# Patient Record
Sex: Female | Born: 1975 | Race: White | Hispanic: No | Smoking: Current some day smoker
Health system: Southern US, Community
[De-identification: ages and names within clinical notes are randomized; demographics above are authoritative.]

## PROBLEM LIST (undated history)

## (undated) DIAGNOSIS — F419 Anxiety disorder, unspecified: Secondary | ICD-10-CM

## (undated) DIAGNOSIS — I889 Nonspecific lymphadenitis, unspecified: Secondary | ICD-10-CM

## (undated) HISTORY — DX: Anxiety disorder, unspecified: F41.9

## (undated) HISTORY — PX: TUBAL LIGATION: SHX77

## (undated) HISTORY — PX: FOOT SURGERY: SHX648

## (undated) HISTORY — PX: UTERINE FIBROID SURGERY: SHX826

## (undated) HISTORY — PX: CHOLECYSTECTOMY: SHX55

## (undated) HISTORY — PX: APPENDECTOMY: SHX54

---

## 1997-07-20 ENCOUNTER — Emergency Department (HOSPITAL_COMMUNITY): Admission: EM | Admit: 1997-07-20 | Discharge: 1997-07-20 | Payer: Self-pay | Admitting: Emergency Medicine

## 1997-09-24 ENCOUNTER — Ambulatory Visit (HOSPITAL_COMMUNITY): Admission: RE | Admit: 1997-09-24 | Discharge: 1997-09-24 | Payer: Self-pay | Admitting: Obstetrics

## 1998-01-01 ENCOUNTER — Inpatient Hospital Stay (HOSPITAL_COMMUNITY): Admission: AD | Admit: 1998-01-01 | Discharge: 1998-01-03 | Payer: Self-pay | Admitting: Obstetrics

## 1998-05-30 ENCOUNTER — Ambulatory Visit (HOSPITAL_COMMUNITY): Admission: RE | Admit: 1998-05-30 | Discharge: 1998-05-30 | Payer: Self-pay | Admitting: Obstetrics

## 1998-12-14 ENCOUNTER — Emergency Department (HOSPITAL_COMMUNITY): Admission: EM | Admit: 1998-12-14 | Discharge: 1998-12-14 | Payer: Self-pay | Admitting: Emergency Medicine

## 1998-12-16 ENCOUNTER — Emergency Department (HOSPITAL_COMMUNITY): Admission: EM | Admit: 1998-12-16 | Discharge: 1998-12-16 | Payer: Self-pay | Admitting: Emergency Medicine

## 1999-11-14 ENCOUNTER — Inpatient Hospital Stay (HOSPITAL_COMMUNITY): Admission: EM | Admit: 1999-11-14 | Discharge: 1999-11-14 | Payer: Self-pay | Admitting: Obstetrics

## 2000-04-05 ENCOUNTER — Inpatient Hospital Stay (HOSPITAL_COMMUNITY): Admission: AD | Admit: 2000-04-05 | Discharge: 2000-04-05 | Payer: Self-pay | Admitting: Obstetrics

## 2000-07-01 ENCOUNTER — Inpatient Hospital Stay (HOSPITAL_COMMUNITY): Admission: AD | Admit: 2000-07-01 | Discharge: 2000-07-01 | Payer: Self-pay | Admitting: Obstetrics

## 2000-11-24 ENCOUNTER — Other Ambulatory Visit: Admission: RE | Admit: 2000-11-24 | Discharge: 2000-11-24 | Payer: Self-pay | Admitting: Family Medicine

## 2004-07-01 ENCOUNTER — Ambulatory Visit: Payer: Self-pay | Admitting: Family Medicine

## 2004-08-04 ENCOUNTER — Encounter: Payer: Self-pay | Admitting: Internal Medicine

## 2004-08-04 LAB — CONVERTED CEMR LAB: Pap Smear: NORMAL

## 2004-08-06 ENCOUNTER — Ambulatory Visit: Payer: Self-pay | Admitting: Internal Medicine

## 2004-08-06 ENCOUNTER — Encounter: Admission: RE | Admit: 2004-08-06 | Discharge: 2004-08-06 | Payer: Self-pay | Admitting: Internal Medicine

## 2004-08-13 ENCOUNTER — Other Ambulatory Visit: Admission: RE | Admit: 2004-08-13 | Discharge: 2004-08-13 | Payer: Self-pay | Admitting: Family Medicine

## 2004-08-13 ENCOUNTER — Ambulatory Visit: Payer: Self-pay | Admitting: Family Medicine

## 2004-08-20 ENCOUNTER — Encounter: Admission: RE | Admit: 2004-08-20 | Discharge: 2004-08-20 | Payer: Self-pay | Admitting: Family Medicine

## 2004-10-24 ENCOUNTER — Encounter: Payer: Self-pay | Admitting: Emergency Medicine

## 2004-10-24 ENCOUNTER — Observation Stay (HOSPITAL_COMMUNITY): Admission: AD | Admit: 2004-10-24 | Discharge: 2004-10-24 | Payer: Self-pay | Admitting: Obstetrics & Gynecology

## 2004-10-31 ENCOUNTER — Encounter: Admission: RE | Admit: 2004-10-31 | Discharge: 2004-10-31 | Payer: Self-pay | Admitting: Obstetrics & Gynecology

## 2004-11-12 ENCOUNTER — Ambulatory Visit (HOSPITAL_COMMUNITY): Admission: RE | Admit: 2004-11-12 | Discharge: 2004-11-12 | Payer: Self-pay | Admitting: Obstetrics & Gynecology

## 2005-05-14 ENCOUNTER — Ambulatory Visit: Payer: Self-pay | Admitting: Internal Medicine

## 2005-08-19 ENCOUNTER — Ambulatory Visit: Payer: Self-pay | Admitting: Internal Medicine

## 2005-12-14 ENCOUNTER — Ambulatory Visit: Payer: Self-pay | Admitting: Family Medicine

## 2006-05-01 ENCOUNTER — Emergency Department (HOSPITAL_COMMUNITY): Admission: EM | Admit: 2006-05-01 | Discharge: 2006-05-01 | Payer: Self-pay | Admitting: Family Medicine

## 2006-05-07 ENCOUNTER — Ambulatory Visit: Payer: Self-pay | Admitting: Family Medicine

## 2006-05-10 ENCOUNTER — Encounter: Admission: RE | Admit: 2006-05-10 | Discharge: 2006-05-10 | Payer: Self-pay | Admitting: Family Medicine

## 2006-05-13 ENCOUNTER — Ambulatory Visit: Payer: Self-pay | Admitting: Family Medicine

## 2006-05-17 ENCOUNTER — Ambulatory Visit: Payer: Self-pay | Admitting: Internal Medicine

## 2006-09-10 ENCOUNTER — Encounter: Payer: Self-pay | Admitting: Internal Medicine

## 2006-09-10 DIAGNOSIS — L259 Unspecified contact dermatitis, unspecified cause: Secondary | ICD-10-CM

## 2006-09-10 DIAGNOSIS — F17200 Nicotine dependence, unspecified, uncomplicated: Secondary | ICD-10-CM | POA: Insufficient documentation

## 2006-09-10 DIAGNOSIS — K219 Gastro-esophageal reflux disease without esophagitis: Secondary | ICD-10-CM

## 2006-09-10 DIAGNOSIS — L219 Seborrheic dermatitis, unspecified: Secondary | ICD-10-CM

## 2007-02-23 ENCOUNTER — Ambulatory Visit: Payer: Self-pay | Admitting: Family Medicine

## 2007-02-23 DIAGNOSIS — R51 Headache: Secondary | ICD-10-CM

## 2007-02-23 DIAGNOSIS — R599 Enlarged lymph nodes, unspecified: Secondary | ICD-10-CM | POA: Insufficient documentation

## 2007-02-23 DIAGNOSIS — R519 Headache, unspecified: Secondary | ICD-10-CM | POA: Insufficient documentation

## 2007-04-12 ENCOUNTER — Ambulatory Visit: Payer: Self-pay | Admitting: Family Medicine

## 2007-04-12 DIAGNOSIS — M26629 Arthralgia of temporomandibular joint, unspecified side: Secondary | ICD-10-CM

## 2007-05-24 ENCOUNTER — Encounter: Payer: Self-pay | Admitting: Internal Medicine

## 2007-05-24 ENCOUNTER — Emergency Department (HOSPITAL_COMMUNITY): Admission: EM | Admit: 2007-05-24 | Discharge: 2007-05-25 | Payer: Self-pay | Admitting: Emergency Medicine

## 2007-06-03 ENCOUNTER — Emergency Department (HOSPITAL_COMMUNITY): Admission: EM | Admit: 2007-06-03 | Discharge: 2007-06-03 | Payer: Self-pay | Admitting: Emergency Medicine

## 2007-06-08 ENCOUNTER — Ambulatory Visit: Payer: Self-pay | Admitting: Internal Medicine

## 2007-06-09 ENCOUNTER — Ambulatory Visit: Payer: Self-pay | Admitting: Oncology

## 2007-06-21 ENCOUNTER — Encounter: Payer: Self-pay | Admitting: Internal Medicine

## 2007-06-21 LAB — COMPREHENSIVE METABOLIC PANEL
BUN: 7 mg/dL (ref 6–23)
CO2: 25 mEq/L (ref 19–32)
Glucose, Bld: 73 mg/dL (ref 70–99)
Sodium: 141 mEq/L (ref 135–145)
Total Bilirubin: 0.3 mg/dL (ref 0.3–1.2)
Total Protein: 6.8 g/dL (ref 6.0–8.3)

## 2007-06-21 LAB — CBC WITH DIFFERENTIAL (CANCER CENTER ONLY)
BASO#: 0.1 10*3/uL (ref 0.0–0.2)
Eosinophils Absolute: 0.3 10*3/uL (ref 0.0–0.5)
HGB: 12.7 g/dL (ref 11.6–15.9)
LYMPH%: 26 % (ref 14.0–48.0)
MCH: 33.1 pg (ref 26.0–34.0)
MCV: 96 fL (ref 81–101)
MONO%: 5.5 % (ref 0.0–13.0)
Platelets: 267 10*3/uL (ref 145–400)
RBC: 3.84 10*6/uL (ref 3.70–5.32)

## 2007-06-21 LAB — IRON AND TIBC
Iron: 94 ug/dL (ref 42–145)
TIBC: 254 ug/dL (ref 250–470)

## 2007-06-21 LAB — SEDIMENTATION RATE: Sed Rate: 5 mm/hr (ref 0–22)

## 2007-06-21 LAB — LACTATE DEHYDROGENASE: LDH: 132 U/L (ref 94–250)

## 2007-06-23 ENCOUNTER — Telehealth (INDEPENDENT_AMBULATORY_CARE_PROVIDER_SITE_OTHER): Payer: Self-pay | Admitting: *Deleted

## 2007-06-23 DIAGNOSIS — Z9189 Other specified personal risk factors, not elsewhere classified: Secondary | ICD-10-CM

## 2007-06-24 ENCOUNTER — Ambulatory Visit (HOSPITAL_COMMUNITY): Admission: RE | Admit: 2007-06-24 | Discharge: 2007-06-24 | Payer: Self-pay | Admitting: Oncology

## 2007-06-24 ENCOUNTER — Encounter: Payer: Self-pay | Admitting: Internal Medicine

## 2007-06-28 ENCOUNTER — Ambulatory Visit: Payer: Self-pay | Admitting: Internal Medicine

## 2007-06-29 ENCOUNTER — Encounter: Payer: Self-pay | Admitting: Internal Medicine

## 2007-06-29 ENCOUNTER — Telehealth (INDEPENDENT_AMBULATORY_CARE_PROVIDER_SITE_OTHER): Payer: Self-pay | Admitting: *Deleted

## 2007-06-29 DIAGNOSIS — I7100 Dissection of unspecified site of aorta: Secondary | ICD-10-CM | POA: Insufficient documentation

## 2007-07-05 LAB — CONVERTED CEMR LAB

## 2007-07-11 ENCOUNTER — Ambulatory Visit: Payer: Self-pay | Admitting: Cardiovascular Disease

## 2007-07-11 ENCOUNTER — Ambulatory Visit: Payer: Self-pay

## 2007-07-22 ENCOUNTER — Ambulatory Visit: Payer: Self-pay | Admitting: Internal Medicine

## 2007-07-22 ENCOUNTER — Emergency Department (HOSPITAL_COMMUNITY): Admission: EM | Admit: 2007-07-22 | Discharge: 2007-07-22 | Payer: Self-pay | Admitting: Emergency Medicine

## 2007-08-02 ENCOUNTER — Telehealth: Payer: Self-pay | Admitting: Internal Medicine

## 2007-08-04 ENCOUNTER — Ambulatory Visit: Payer: Self-pay | Admitting: Internal Medicine

## 2007-08-04 DIAGNOSIS — F410 Panic disorder [episodic paroxysmal anxiety] without agoraphobia: Secondary | ICD-10-CM | POA: Insufficient documentation

## 2007-08-16 ENCOUNTER — Ambulatory Visit: Payer: Self-pay | Admitting: Cardiovascular Disease

## 2008-02-06 ENCOUNTER — Telehealth: Payer: Self-pay | Admitting: Internal Medicine

## 2008-02-07 ENCOUNTER — Ambulatory Visit: Payer: Self-pay | Admitting: Obstetrics and Gynecology

## 2008-04-27 ENCOUNTER — Ambulatory Visit (HOSPITAL_COMMUNITY): Admission: RE | Admit: 2008-04-27 | Discharge: 2008-04-28 | Payer: Self-pay | Admitting: Surgery

## 2008-04-27 ENCOUNTER — Encounter (INDEPENDENT_AMBULATORY_CARE_PROVIDER_SITE_OTHER): Payer: Self-pay | Admitting: Surgery

## 2008-10-06 ENCOUNTER — Emergency Department (HOSPITAL_COMMUNITY): Admission: EM | Admit: 2008-10-06 | Discharge: 2008-10-06 | Payer: Self-pay | Admitting: Family Medicine

## 2008-11-21 ENCOUNTER — Emergency Department (HOSPITAL_COMMUNITY): Admission: EM | Admit: 2008-11-21 | Discharge: 2008-11-21 | Payer: Self-pay | Admitting: Family Medicine

## 2009-06-01 ENCOUNTER — Inpatient Hospital Stay (HOSPITAL_COMMUNITY): Admission: AD | Admit: 2009-06-01 | Discharge: 2009-06-01 | Payer: Self-pay | Admitting: Obstetrics & Gynecology

## 2010-02-12 ENCOUNTER — Inpatient Hospital Stay (HOSPITAL_COMMUNITY): Admission: AD | Admit: 2010-02-12 | Discharge: 2010-02-12 | Payer: Self-pay | Admitting: Obstetrics & Gynecology

## 2010-02-12 ENCOUNTER — Ambulatory Visit: Payer: Self-pay | Admitting: Obstetrics and Gynecology

## 2010-04-27 ENCOUNTER — Encounter: Payer: Self-pay | Admitting: Obstetrics & Gynecology

## 2010-06-17 LAB — URINALYSIS, ROUTINE W REFLEX MICROSCOPIC
Bilirubin Urine: NEGATIVE
Protein, ur: NEGATIVE mg/dL
Specific Gravity, Urine: 1.02 (ref 1.005–1.030)
Urobilinogen, UA: 0.2 mg/dL (ref 0.0–1.0)
pH: 6 (ref 5.0–8.0)

## 2010-06-17 LAB — CBC
HCT: 37.3 % (ref 36.0–46.0)
MCH: 34.5 pg — ABNORMAL HIGH (ref 26.0–34.0)
MCV: 100.2 fL — ABNORMAL HIGH (ref 78.0–100.0)
Platelets: 177 10*3/uL (ref 150–400)
RBC: 3.73 MIL/uL — ABNORMAL LOW (ref 3.87–5.11)

## 2010-06-17 LAB — POCT PREGNANCY, URINE: Preg Test, Ur: NEGATIVE

## 2010-06-17 LAB — WET PREP, GENITAL
Trich, Wet Prep: NONE SEEN
Yeast Wet Prep HPF POC: NONE SEEN

## 2010-06-17 LAB — URINE MICROSCOPIC-ADD ON

## 2010-06-17 LAB — GC/CHLAMYDIA PROBE AMP, GENITAL: GC Probe Amp, Genital: NEGATIVE

## 2010-06-25 LAB — URINE MICROSCOPIC-ADD ON

## 2010-06-25 LAB — URINALYSIS, ROUTINE W REFLEX MICROSCOPIC
Ketones, ur: 15 mg/dL — AB
Nitrite: NEGATIVE
pH: 6 (ref 5.0–8.0)

## 2010-07-12 LAB — DIFFERENTIAL
Basophils Absolute: 0.1 10*3/uL (ref 0.0–0.1)
Basophils Relative: 1 % (ref 0–1)
Eosinophils Absolute: 0.1 10*3/uL (ref 0.0–0.7)
Eosinophils Relative: 1 % (ref 0–5)
Monocytes Absolute: 0.7 10*3/uL (ref 0.1–1.0)
Monocytes Relative: 6 % (ref 3–12)

## 2010-07-12 LAB — CBC
Hemoglobin: 13.2 g/dL (ref 12.0–15.0)
MCHC: 34.8 g/dL (ref 30.0–36.0)
MCV: 100.6 fL — ABNORMAL HIGH (ref 78.0–100.0)
RBC: 3.79 MIL/uL — ABNORMAL LOW (ref 3.87–5.11)
RDW: 13.9 % (ref 11.5–15.5)

## 2010-07-13 LAB — POCT I-STAT, CHEM 8
Calcium, Ion: 1.13 mmol/L (ref 1.12–1.32)
Glucose, Bld: 78 mg/dL (ref 70–99)
HCT: 43 % (ref 36.0–46.0)
Hemoglobin: 14.6 g/dL (ref 12.0–15.0)
TCO2: 24 mmol/L (ref 0–100)

## 2010-07-21 LAB — PREGNANCY, URINE: Preg Test, Ur: NEGATIVE

## 2010-08-19 NOTE — Assessment & Plan Note (Signed)
[facility]                                 ON-CALL NOTE   Melissa Allison, BOERNER                       MRN:          [id number]  DATE:06/02/2007                            DOB:          [birth year]    PCP:  Idamae Schuller A. Tower, M.D.   Ms. Melissa Allison is a patient of Dr. Milinda Antis, who called in stating that she  was started on the 18th or 19th on Keflex for an infection that was seen  at the [facility] Emergency Department.  Yesterday, she noted a  burning, red rash on her face, so she discontinued the medication last  night.  As of today, she complains of swelling of her tongue and some  swelling in the back of her throat.  She states that she is able to  breathe, but has noted some slight difficulty swallowing.   PLAN:  I advised patient that she should immediately call 911 to be  taken to the emergency department for further evaluation.  Patient  states that she does not feel that it is that urgent.  I advised  patient, given those symptoms and a possible anaphylactic reaction to  this medication, she needs emergent medical attention.  She states that  she is a single mother and will have to call someone to take care of her  children, but promises that she will go to the emergency department  ASAP.  Patient expressed understanding of my concerns and, again, agreed  to go to the emergency department.  I didn't recommend taking bendaryl  due to patient expressing swelling in throat.     Leanne Chang, M.D.  Electronically Signed    LA/MedQ  DD: 06/02/2007  DT: 06/03/2007  Job #: [id number]   cc:   Marne A. Milinda Antis, MD

## 2010-08-19 NOTE — Letter (Signed)
July 15, 2007    Karie Schwalbe, MD  [address] [place], [place] [address]   RE:  Melissa Allison, Melissa Allison  MRN:  [id number]  /  DOB:  April 10, 1975   Dear Dr. 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   Ms. Klausner is a 35 year old woman with no cardiovascular history who  has been undergoing an evaluation for lymphadenopathy.  This evaluation  included CAT scans of the chest, abdomen and pelvis.  Incidentally noted  on her abdominal CT was a small focal peripheral dissection of the  aorta beginning at the level of the interior mesenteric artery.  Because of this radiographic abnormality, she was referred here for  further evaluation.   Ms. Rinke has no history of hypertension.  She denies a family  history of vascular disease.  This includes no history of coronary  artery disease, stroke aortic dissection or aortic aneurysm.  She denies  abdominal pain.  Other than the lymphadenopathy noted above, her current  symptoms include low-grade fevers, fatigue and poor appetite.  She  denies chest pain.  She denies dyspnea, chest or abdominal pain,  orthopnea, PND or edema.   PAST MEDICAL HISTORY:  Includes uterine bleeding that required  cauterization procedure in 2006.  She was severely anemic and required a  blood transfusion, appendectomy, tubal ligation, foot surgery in 2007,  and several hospitalizations for recurrent pyelonephritis.   SOCIAL HISTORY:  The patient is married with three children.  She smokes  one-half pack of cigarettes daily for the last 15 years.  She rarely  drinks alcohol and has a remote history of drug use but none in the last  10 years.   FAMILY HISTORY:  There is no history of coronary artery disease, stroke,  TIA or aortic pathology in any first-degree family members.  There is no  history of sudden death in the family.   REVIEW OF SYSTEMS:  Pertinent positives  included fatigue, urinary  symptoms, anxiety, dizziness, and headaches.  All other 12-point systems  were reviewed and are negative except as detailed above.   MEDICATIONS:  Include lorazepam 0.5 mg b.i.d.   ALLERGIES:  Are multiple ANTIBIOTICS including KEFLEX and SULFA, as well  as ACIPHEX, PREMARIN and EPHEDRINE.   EXAMINATION:  The patient is alert and oriented.  She is a healthy-  appearing young woman in no acute distress.  Weight is 110 pounds, blood  pressure is 104/70 in both arms, heart rate is 76, respiratory rate is  12.  HEENT:  Normal.  NECK:  Normal carotid upstrokes without bruits.  Jugular venous pressure  is normal.  No thyromegaly or thyroid nodules.  LUNGS:  Clear bilaterally.  HEART:  The apex is discreet and nondisplaced.  The heart is regular  rate and rhythm without murmurs or gallops.  There is no right  ventricular heave or lift.  ABDOMEN:  Soft, nontender, no organomegaly.  No abdominal bruits.  BACK:  There is no CVA tenderness.  EXTREMITIES:  No clubbing, cyanosis or edema.  Peripheral pulses are 2+  and equal throughout.  SKIN:  Warm and dry without rash.  NEUROLOGIC:  Cranial nerves II-XII are intact.  Strength 5/5 and equal.   EKG shows sinus rhythm and is within normal limits.  Heart rate 76 beats  per minute.   ASSESSMENT:  This is a 35 year old woman with an incidental finding of a  small focal dissection of the abdominal aorta.  This finding is in the  setting of no risk factors for vascular disease other than cigarette  use.  I think the chances of Ms. Auxier having an abdominal aortic  dissection are extremely low.  In short, this clinical scenario just  does not make sense.  We decided to perform an abdominal aortic  ultrasound in the office today and this demonstrated a normal abdominal  aorta throughout as well as normal proximal common iliac arteries.  There was no evidence of dissection, double-lumen or flow disturbance.  Both the  superior and inferior mesenteric arteries are widely patent  with normal velocities.   I think for absolute certainty it would be reasonable to repeat a CAT  scan in approximately 3 months just to be absolutely sure that there is  no dissection here.  Again, this is just precautionary and I do not  think there is any true aortic pathology.  The CAT scan may also serve  to follow up the patient's lymphadenopathy, which at this point is not  pathologic.  I will review this with Dr. Welton Flakes to see if she would like  me to order the full chest, abdomen and pelvis CT for follow-up of her  lymphadenopathy.   Dr. Alphonsus Sias, thanks again for asking me to see Ms. Roberge.  Please feel  free to call at any time with questions.    Sincerely,      Veverly Fells. Excell Seltzer, MD  Electronically Signed    MDC/MedQ  DD: 07/15/2007  DT: 07/15/2007  Job #: [id number]   CC:    Drue Second, MD

## 2010-08-19 NOTE — Assessment & Plan Note (Signed)
[name], Melissa Allison              ACCOUNT NO.:  [id number]   MEDICAL RECORD NO.:  [id number]          PATIENT TYPE:  POB   LOCATION:  [facility] at [employer]         FACILITY:  [facility]   PHYSICIAN:  Argentina Donovan, MD        DATE OF BIRTH:  [birth year]   DATE OF SERVICE:                                  CLINIC NOTE   The patient is referred by [place] because of a LSIL:  CIN 1/AIN 1/HPV-  interpreted Pap smear that was done on September 06, 2007.  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  She was sent to [place] for colposcopy.  We explained the procedure to her in  detail and how it was done and why it was done.  The patient is a  smoker, and we have counseled her significantly to try and break that  habit, which is somewhat difficult because her husband is also a smoker.  The colposcopy was done in the following method:  The patient was placed  in dorsal lithotomy position.  External genitalia was normal with 2  small piercings, 1 around the clitoris and 1 just below the introitus  marital.  The vagina was clean and well rugated.  The cervix was clean  and parous with elliptical transverse os.  No sign of erosion.  The  cervix was treated with 3% acetic acid, and the transition zone was  easily seen, 360 degrees.  There were acetowhite changes around the  entire circumference of the cervix without mosaicism, abnormal vessels,  or punctations.  Endocervical curettage was done without incident  followed by endocervical brushing and a biopsy at 12 o'clock at the  largest and most prominent area of acetowhite change.  Monsel solution  was then applied to control bleeding.  The patient will return in 2  weeks for discussion of followup treatment if any needed.   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          ______________________________  Argentina Donovan, MD     PR/MEDQ  D:  02/07/2008  T:  02/08/2008  Job:  [id number]

## 2010-08-19 NOTE — Op Note (Signed)
Melissa Allison, Melissa Allison              ACCOUNT NO.:  [id number]   MEDICAL RECORD NO.:  [id number]          PATIENT TYPE:  OIB   LOCATION:  1508                         FACILITY:  [facility]   PHYSICIAN:  Thornton Park. Daphine Deutscher, MD  DATE OF BIRTH:  10-24-1975   DATE OF PROCEDURE:  04/27/2008  DATE OF DISCHARGE:  04/28/2008                               OPERATIVE REPORT   PROCEDURE:  Laparoscopic cholecystectomy with intraoperative  cholangiogram.   SURGEON:  Thornton Park. Daphine Deutscher, M.D.   ASSISTANT:  Alfonse Ras, M.D.   ANESTHESIA:  General endotracheal.   DESCRIPTION OF PROCEDURE:  Melissa Allison was taken to Room 11 on Friday  afternoon, April 27, 2008, and given general anesthesia.  The abdomen  was prepped with chloroxylenol and draped sterilely.  A longitudinal  incision was made under umbilicus through which the [facility] cannula was  deployed.  The abdomen was insufflated and then three 5-mm ports were  placed in the upper abdomen, avoiding the tattoos on the right side, as  per her request.  The gallbladder was long and hanging down and rather  ptotic and was stuck to the omentum.  This was taken down with sharp  dissection and then I was able to gradually get the gallbladder up in  strip away the adhesions to it.  This was very indicative of chronic  cholecystitis.  I went ahead and dissected free Calot's triangle and she  had a prominent Calot's node.  I went ahead and used a 5-mm clip applier  to clip the cystic artery and I put up one up on the gallbladder and  then did a cholangiogram which showed a long cystic duct with  intrahepatic filling and free flow into the duodenum.  The cystic duct  was then triple clipped, divided, the cystic artery was divided and the  gallbladder was removed the gallbladder bed with the hook electrocautery  without entering it.  I then brought it out through the umbilicus using  a bag and found that she had three large stones.  I had to enlarge the  incision just because the stones were so large and they would otherwise  not come out.   I went back in and repaired the umbilical defect with a figure-of-eight  suture and a simple suture 0 Vicryl under laparoscopic vision using the  5 scope from above and then surveyed the abdomen again, no bleeding or  bile leaks were noted.  Everything appeared to be in order.  I injected  the wounds with Marcaine and closed with 4-0 Vicryl and with Dermabond.  The patient was taken to the recovery room in satisfactory condition.      Thornton Park Daphine Deutscher, MD  Electronically Signed     MBM/MEDQ  D:  04/27/2008  T:  04/29/2008  Job:  [id number]

## 2010-09-07 ENCOUNTER — Inpatient Hospital Stay (INDEPENDENT_AMBULATORY_CARE_PROVIDER_SITE_OTHER)
Admission: RE | Admit: 2010-09-07 | Discharge: 2010-09-07 | Disposition: A | Discharge status: Home / Self Care | Payer: Self-pay | Source: Ambulatory Visit | Attending: Emergency Medicine | Admitting: Emergency Medicine

## 2010-09-07 DIAGNOSIS — N390 Urinary tract infection, site not specified: Secondary | ICD-10-CM

## 2010-09-07 LAB — POCT URINALYSIS DIP (DEVICE)
Glucose, UA: NEGATIVE mg/dL
Hgb urine dipstick: NEGATIVE
Protein, ur: NEGATIVE mg/dL
Specific Gravity, Urine: 1.015 (ref 1.005–1.030)
Urobilinogen, UA: 0.2 mg/dL (ref 0.0–1.0)
pH: 6 (ref 5.0–8.0)

## 2010-11-02 ENCOUNTER — Ambulatory Visit (INDEPENDENT_AMBULATORY_CARE_PROVIDER_SITE_OTHER): Payer: Self-pay

## 2010-11-02 ENCOUNTER — Inpatient Hospital Stay (INDEPENDENT_AMBULATORY_CARE_PROVIDER_SITE_OTHER)
Admission: RE | Admit: 2010-11-02 | Discharge: 2010-11-02 | Disposition: A | Discharge status: Home / Self Care | Payer: Self-pay | Source: Ambulatory Visit | Attending: Family Medicine | Admitting: Family Medicine

## 2010-11-02 DIAGNOSIS — R071 Chest pain on breathing: Secondary | ICD-10-CM

## 2010-12-26 LAB — MONONUCLEOSIS SCREEN: Mono Screen: NEGATIVE

## 2010-12-26 LAB — CBC
MCHC: 35.3
MCV: 96.2
Platelets: 235

## 2010-12-26 LAB — DIFFERENTIAL
Basophils Relative: 1
Eosinophils Absolute: 0.2
Neutrophils Relative %: 62

## 2010-12-30 LAB — URINALYSIS, ROUTINE W REFLEX MICROSCOPIC
Glucose, UA: NEGATIVE
Ketones, ur: NEGATIVE
Specific Gravity, Urine: 1.007
pH: 7

## 2010-12-30 LAB — URINE MICROSCOPIC-ADD ON

## 2010-12-30 LAB — COMPREHENSIVE METABOLIC PANEL
AST: 19
Albumin: 3.9
Chloride: 110
Creatinine, Ser: 0.81
GFR calc Af Amer: >60
Potassium: 3.7
Total Bilirubin: 0.2 — ABNORMAL LOW

## 2010-12-30 LAB — POCT CARDIAC MARKERS
CKMB, poc: <1 — ABNORMAL LOW
Myoglobin, poc: 25.3

## 2010-12-30 LAB — DIFFERENTIAL
Basophils Absolute: 0.1
Eosinophils Relative: 2
Lymphocytes Relative: 19
Monocytes Absolute: 0.6
Monocytes Relative: 6

## 2010-12-30 LAB — CBC
MCV: 97.4
Platelets: 219
WBC: 9.3

## 2010-12-30 LAB — PREGNANCY, URINE: Preg Test, Ur: NEGATIVE

## 2011-08-24 ENCOUNTER — Encounter (HOSPITAL_COMMUNITY): Payer: Self-pay | Admitting: Emergency Medicine

## 2011-08-24 ENCOUNTER — Emergency Department (HOSPITAL_COMMUNITY)
Admission: EM | Admit: 2011-08-24 | Discharge: 2011-08-24 | Discharge status: Against Medical Advice | Payer: Self-pay | Attending: Emergency Medicine | Admitting: Emergency Medicine

## 2011-08-24 DIAGNOSIS — D235 Other benign neoplasm of skin of trunk: Secondary | ICD-10-CM | POA: Insufficient documentation

## 2011-08-24 DIAGNOSIS — D229 Melanocytic nevi, unspecified: Secondary | ICD-10-CM

## 2011-08-24 DIAGNOSIS — F17200 Nicotine dependence, unspecified, uncomplicated: Secondary | ICD-10-CM | POA: Insufficient documentation

## 2011-08-24 HISTORY — DX: Nonspecific lymphadenitis, unspecified: I88.9

## 2011-08-24 NOTE — ED Notes (Signed)
States has a spot on back that "burns"-mole on back, under bra strap -- feels irritated

## 2011-08-24 NOTE — Discharge Instructions (Signed)
Melissa Allison the growth on your back can be evaluated by a dermatologist.  The mole can be biopsied at a dermatologist office.   Mole Moles are usually harmless skin spots. Moles can be different colors, from light brown to black. People usually develop many of them over their lifetime. Moles increase in number during the first 3 decades of life. Moles can be anywhere on the body. They may be flat or raised and sometimes they contain hair. Although moles can change over time, they only rarely turn into skin cancer. The moles that do not turn into cancer (benign) are usually:  Small (the size of a pencil eraser or less).   One color.   Smooth.   Have a uniform border.   Round and oval and do not change much in appearance over time.  Malignant melanoma is a skin cancer that starts like a mole. Moles that were present at birth are more likely to turn into a melanoma later in life especially if they were very large (larger than 7 inches [20 cm] in diameter) at birth. Melanomas are different from moles. Melanomas:  Have Borders that are more ragged.   Have more than one color (red, white or blue) in addition to brown or black.   Are usually bigger.  Years of sun exposure increases the risk for getting melanoma. It is important to look at your moles regularly so that you can notice any changes that may occur within anyone of them that make it different from your other moles on your body. Know the A, B, C, D's of your moles - a mole is more worrisome for a melanoma if: A: It is Asymmetrical (one half is different from the other half). B: The Borders are jagged, etched or irregular. C: The Color is uneven (shades of brown, tan and/or black).  D: The Diameter is greater than a quarter of an inch (6 mm). SEEK MEDICAL CARE IF:  Your mole bleeds or becomes an open sore or does not heal.   Your mole grows rapidly or changes color.   Your mole becomes irregular and bumpy.   Your mole becomes  painful, itchy, tender or sensitive.  Document Released: 04/30/2004 Document Revised: 03/12/2011 Document Reviewed: 03/09/2008 [facility] Patient Information 2012 [place], [place].

## 2011-08-24 NOTE — ED Provider Notes (Signed)
History     CSN: [id number]  Arrival date & time 08/24/11  7829   First MD Initiated Contact with Patient 08/24/11 1015      Chief Complaint  Patient presents with  . irritated  mole on back     (Consider location/radiation/quality/duration/timing/severity/associated sxs/prior treatment) The history is provided by the patient. No language interpreter was used.   Cc: 36 year old female here today complaining of a "mole" on her back that has been there her whole life.  States that her father and mother have been diagnosed with cancer and she wants someone to see her today and tell her if the mole is cancer. States that she has no insurance and she does not have the money to go to a specialist. States that she wants someone to the cancer Center to come in with her mole diagnosed whether it's cancer or not "today".  Teary eyed in triage.  " There are 3 small children depending on me and this needs to be settled today"  Area in question is a pink, flat 2cm area on her mid back.  Patient became angry when i told her we do not excise moles in the ER.    Past Medical History  Diagnosis Date  . Lymphadenitis     Past Surgical History  Procedure Date  . Cholecystectomy   . Appendectomy   . Foot surgery   . Tubal ligation     No family history on file.  History  Substance Use Topics  . Smoking status: Current Everyday Smoker -- 1.0 packs/day  . Smokeless tobacco: Not on file  . Alcohol Use: No    OB History    Grav Para Term Preterm Abortions TAB SAB Ect Mult Living                  Review of Systems  Constitutional: Negative.   HENT: Negative.   Eyes: Negative.   Respiratory: Negative.   Cardiovascular: Negative.   Gastrointestinal: Negative.   Skin:       Mole on back was itching last year burning now  Neurological: Negative.   Psychiatric/Behavioral: Negative.   All other systems reviewed and are negative.    Allergies  Cephalexin; Conjugated estrogens;  Cyclobenzaprine hcl; Ephedrine sulfate; Rabeprazole sodium; and Sulfonamide derivatives  Home Medications  No current outpatient prescriptions on file.  BP 109/71  Pulse 55  Temp(Src) 97.9 F (36.6 C) (Oral)  Resp 16  Ht 5\' 3"  (1.6 m)  Wt 98 lb (44.453 kg)  BMI 17.36 kg/m2  SpO2 99%  LMP 08/03/2011  Physical Exam  Nursing note and vitals reviewed. Constitutional: She is oriented to person, place, and time. She appears well-developed and well-nourished.  HENT:  Head: Normocephalic and atraumatic.  Eyes: Conjunctivae and EOM are normal. Pupils are equal, round, and reactive to light.  Neck: Normal range of motion. Neck supple.  Cardiovascular: Normal rate.   Pulmonary/Chest: Effort normal and breath sounds normal.  Abdominal: Soft. Bowel sounds are normal.  Musculoskeletal: Normal range of motion. She exhibits no edema and no tenderness.  Neurological: She is alert and oriented to person, place, and time. She has normal reflexes.  Skin: Skin is warm and dry.       symetrical 2cm round flat area light pink no bleeding.  Psychiatric: She has a normal mood and affect.    ED Course  Procedures (including critical care time)  Labs Reviewed - No data to display No results found.   No diagnosis  found.    MDM  Here to get a mole excised in the ER.  Became angry and left ama after exam and before discharge because we could not get a doctor in here from the cancer center to excise her mole.         Remi Haggard, NP 08/24/11 1840

## 2011-08-25 NOTE — ED Provider Notes (Signed)
Medical screening examination/treatment/procedure(s) were conducted as a shared visit with non-physician practitioner(s) and myself.  I personally evaluated the patient during the encounter  The patient has evidence of a mole which may benefit from biopsy as an outpatient.  There is no indication or ability to biopsy moles in the emergency department.  This was explained to the patient.  I recommended close followup with her primary care physician or in urgent care to have this biopsied.  It was stressed that she may have to come up with the funds herself to pay for this.  There is no signs of infection.  Lyanne Co, MD 08/25/11 1319

## 2011-09-09 ENCOUNTER — Encounter: Payer: Self-pay | Admitting: Family Medicine

## 2011-09-09 ENCOUNTER — Ambulatory Visit (INDEPENDENT_AMBULATORY_CARE_PROVIDER_SITE_OTHER): Payer: Self-pay | Admitting: Family Medicine

## 2011-09-09 VITALS — BP 99/66 | HR 83 | Temp 98.2°F | Ht 63.0 in | Wt 97.0 lb

## 2011-09-09 DIAGNOSIS — R87612 Low grade squamous intraepithelial lesion on cytologic smear of cervix (LGSIL): Secondary | ICD-10-CM

## 2011-09-09 DIAGNOSIS — F39 Unspecified mood [affective] disorder: Secondary | ICD-10-CM

## 2011-09-09 DIAGNOSIS — D239 Other benign neoplasm of skin, unspecified: Secondary | ICD-10-CM

## 2011-09-09 DIAGNOSIS — IMO0002 Reserved for concepts with insufficient information to code with codable children: Secondary | ICD-10-CM

## 2011-09-09 DIAGNOSIS — D229 Melanocytic nevi, unspecified: Secondary | ICD-10-CM | POA: Insufficient documentation

## 2011-09-09 NOTE — Assessment & Plan Note (Signed)
Patient did not attend today for full evaluation. At next visit would like to discuss possibility of bipolar disorder and give GAD. Likely send to [facility] for therapy

## 2011-09-09 NOTE — Progress Notes (Signed)
  Subjective:    Patient ID: Melissa Allison, female    DOB: 01-10-1976, 36 y.o.   MRN: [id number]  HPI  Mole on back- itching and burning x 1 year. Mole seems to be growing.  Used to be flat.    Had a friend die from cancer, so she is concerned about cancer now.  Mood- sleep issues, can't sleep for days.  Feels like has a lot of energy during those times.  Recent break up from her boyfriend, moving, lots of stress.  Also has times of depression.    Pap smear- last one in 2009.  Had LSIL.  Had colonoscopy but does not know the results of that.  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   Review of Systems Significant for anxiety and mood disorder. No shortness breath, chest pain, headache.    Objective:   Physical Exam Vital signs reviewed General appearance - alert, well appearing, and in no distress Skin- there is a half centimeter raised lesion that is pink in color with some brown on the periphery.       Assessment & Plan:

## 2011-09-09 NOTE — Assessment & Plan Note (Signed)
Plan to biopsy mole at next visit. Patient needs to register for [facility] card.

## 2011-09-09 NOTE — Assessment & Plan Note (Signed)
Repeat Pap at next visit.

## 2011-09-09 NOTE — Patient Instructions (Signed)
Please make an appointment to come back after you have a [facility] card We can biopsy that lesion on your back and do your Pap smear We can also order your labs at that time

## 2011-10-05 ENCOUNTER — Other Ambulatory Visit: Payer: Self-pay

## 2011-10-05 ENCOUNTER — Ambulatory Visit (INDEPENDENT_AMBULATORY_CARE_PROVIDER_SITE_OTHER): Payer: Self-pay | Admitting: Family Medicine

## 2011-10-05 ENCOUNTER — Ambulatory Visit (HOSPITAL_COMMUNITY)
Admission: RE | Admit: 2011-10-05 | Discharge: 2011-10-05 | Disposition: A | Discharge status: Home / Self Care | Payer: Self-pay | Source: Ambulatory Visit | Attending: Family Medicine | Admitting: Family Medicine

## 2011-10-05 ENCOUNTER — Encounter: Payer: Self-pay | Admitting: Family Medicine

## 2011-10-05 VITALS — BP 106/66 | HR 78 | Ht 63.0 in | Wt 91.0 lb

## 2011-10-05 DIAGNOSIS — R079 Chest pain, unspecified: Secondary | ICD-10-CM | POA: Insufficient documentation

## 2011-10-05 DIAGNOSIS — F411 Generalized anxiety disorder: Secondary | ICD-10-CM

## 2011-10-05 DIAGNOSIS — I498 Other specified cardiac arrhythmias: Secondary | ICD-10-CM | POA: Insufficient documentation

## 2011-10-05 DIAGNOSIS — R200 Anesthesia of skin: Secondary | ICD-10-CM

## 2011-10-05 DIAGNOSIS — R209 Unspecified disturbances of skin sensation: Secondary | ICD-10-CM

## 2011-10-05 MED ORDER — BUPROPION HCL ER (XL) 150 MG PO TB24: 150.0000 mg | ORAL_TABLET | Freq: Every day | ORAL | Status: DC

## 2011-10-05 NOTE — Patient Instructions (Addendum)
Schedule adult physical appointment with Dr. Tye Savoy in 2 weeks. Start taking Wellbutrin 150 mg 1 tablet daily for anxiety. You may take Motrin over the counter 600 mg every 6 hours as needed for pain. If you need to speak to a counselor at anytime, please call the Crisis Line [facility].  WELLBUTRIN:  What are some side effects of this drug?  All drugs may cause side effects. However, many people have no side effects or only have minor side effects. Call your doctor if any of these side effects or any other side effects bother you.  Marland KitchenFeeling lightheaded, sleepy, having blurred eyesight, or a change in thinking clearly. Avoid driving and doing other tasks or actions that call for you to be alert or have clear eyesight until you see how this drug affects you.  .You may see the tablet shell in your stool.  Marland KitchenHot flashes.  .Headache.  .Belly pain.  .Shakiness.  .Nervous and excitable.  Marland KitchenUpset stomach or throwing up. Many small meals, good mouth care, sucking hard, sugar-free candy, or chewing sugar-free gum may help.  .Hard stools (constipation). Drinking more liquids, working out, or adding fiber to your diet may help. Talk with your doctor about a stool softener or laxative.  .Dry mouth. Good mouth care, sucking hard, sugar-free candy, or chewing sugar-free gum may help. See a dentist often.  .Not able to sleep.  .Bad taste in your mouth. This most often goes back to normal.  .Not hungry.  Marland KitchenUnsafe allergic effects may rarely happen. What are some side effects that I need to call my doctor about right away?  If you have any of these side effects:  .Signs of an allergic reaction, like rash; hives; itching; red, swollen, blistered, or peeling skin with or without fever; wheezing; tightness in the chest or throat; trouble breathing or talking; unusual hoarseness; or swelling of the mouth, face, lips, tongue, or throat.  .Signs of low mood (depression), thoughts of killing  yourself, nervousness, emotional ups and downs, thinking that is not normal, anxiety, or lack of interest in life.  .Change in thinking clearly and with logic.  .A fast heartbeat.  .Very bad headache.  .Very nervous and excitable.  .If you have not been able to quit smoking after taking this drug for 12 weeks.  .If seizures are new or worse after starting this drug.  These are not all of the side effects that may occur. If you have questions about side effects, call your doctor. Call your doctor for medical advice about side effects.  You may report side effects to the FDA at 1-800-FDA-1088. You may also report side effects at http://www.martinez-harris.com/.  You may report side effects to [employer]'s [place] Vigilance Program at (410)208-7405.

## 2011-10-06 ENCOUNTER — Encounter: Payer: Self-pay | Admitting: Family Medicine

## 2011-10-06 DIAGNOSIS — R200 Anesthesia of skin: Secondary | ICD-10-CM | POA: Insufficient documentation

## 2011-10-06 NOTE — Assessment & Plan Note (Addendum)
Left arm numbness and soreness likely secondary to anxiety disorder.  EKG was obtained due to LT arm and LT chest soreness.  EKG NSR, no ST abnormalities. Due to normal EKG in a healthy female, this is less likely cardiac in origin.  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 Ibuprofen for soreness, warm or ice pads to neck. Will treat anxiety with Welbutrin and follow up GAD in 2-4 weeks.

## 2011-10-06 NOTE — Progress Notes (Signed)
  Subjective:    Patient ID: Melissa Allison, female    DOB: 25-Apr-1975, 36 y.o.   MRN: [id number]  HPI  Patient presents to clinic to meet new doctor.  She complains of LT arm soreness, weakness, and numbness that has been going on for 2-3 weeks.  Associated with LT shoulder and chest discomfort.  Patient denies any SOB, dyspnea, nausea/vomiting, diaphoresis.  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  Describes soreness as "burning sensation", constant.  No relieved or aggravated by anything.   She does not have a strong family history of CAD nor does she have any history of HL, HTN, DM.  Patient is tearful throughout exam and wants to get out of [place] and start over.  She denies any homicidal or suicidal ideations.  States "I need to live for my kids."  No previous hx of suicidal thoughts, and denies any recent drug or alcohol abuse.   Review of Systems  Per HPI    Objective:   Physical Exam  Constitutional: No distress.       Tearful; very thin  Neck: Neck supple.       No bony tenderness of cervical spine; no muscle tension, erythema, or warmth.  Full ROM.    Cardiovascular: Normal rate and regular rhythm.   Skin:       Covered in tattoos, but no rashes or bruises  Psychiatric: Her speech is normal and behavior is normal. She exhibits a depressed mood. She expresses no homicidal and no suicidal ideation.    EKG: normal sinus, no ST abnormalities      Assessment & Plan:

## 2011-10-06 NOTE — Assessment & Plan Note (Signed)
GAD likely complicated by underlying mood disorder or major depression. Will treat with Bupropion 150 daily and follow up in 2 weeks. Counseling and crisis line information given to patient for social support.

## 2011-10-20 ENCOUNTER — Other Ambulatory Visit (HOSPITAL_COMMUNITY)
Admission: RE | Admit: 2011-10-20 | Discharge: 2011-10-20 | Disposition: A | Discharge status: Home / Self Care | Payer: Self-pay | Source: Ambulatory Visit | Attending: Family Medicine | Admitting: Family Medicine

## 2011-10-20 ENCOUNTER — Ambulatory Visit (INDEPENDENT_AMBULATORY_CARE_PROVIDER_SITE_OTHER): Payer: Self-pay | Admitting: Family Medicine

## 2011-10-20 ENCOUNTER — Encounter: Payer: Self-pay | Admitting: Family Medicine

## 2011-10-20 VITALS — BP 104/67 | HR 87 | Temp 98.5°F | Ht 63.0 in | Wt 96.0 lb

## 2011-10-20 DIAGNOSIS — Z01419 Encounter for gynecological examination (general) (routine) without abnormal findings: Secondary | ICD-10-CM | POA: Insufficient documentation

## 2011-10-20 DIAGNOSIS — Z789 Other specified health status: Secondary | ICD-10-CM

## 2011-10-20 DIAGNOSIS — Z113 Encounter for screening for infections with a predominantly sexual mode of transmission: Secondary | ICD-10-CM | POA: Insufficient documentation

## 2011-10-20 DIAGNOSIS — Z23 Encounter for immunization: Secondary | ICD-10-CM

## 2011-10-20 DIAGNOSIS — Z2089 Contact with and (suspected) exposure to other communicable diseases: Secondary | ICD-10-CM

## 2011-10-20 DIAGNOSIS — Z0000 Encounter for general adult medical examination without abnormal findings: Secondary | ICD-10-CM

## 2011-10-20 DIAGNOSIS — Z202 Contact with and (suspected) exposure to infections with a predominantly sexual mode of transmission: Secondary | ICD-10-CM

## 2011-10-20 DIAGNOSIS — Z9189 Other specified personal risk factors, not elsewhere classified: Secondary | ICD-10-CM | POA: Insufficient documentation

## 2011-10-20 NOTE — Progress Notes (Signed)
  Subjective:     Melissa Allison is a 36 y.o. female and is here for a comprehensive physical exam. The patient reports possible exposure to STD and Hepatitis due to history of tattoos.  She would like to be checked for STD and Hepatitis C.  Patient cannot recall last pap exam date.  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  She is requesting immunizations, but we do not have data on [facility].  Patient will contact her high school and go to [facility] for immunizations.  Of note, patient is not taking SSRI for GAD due to fear of side effects.  She says her home life is improving and her stress is improving as well.   History   Social History  . Marital Status: Divorced    Spouse Name: N/A    Number of Children: N/A  . Years of Education: N/A   Occupational History  . Not on file.   Social History Main Topics  . Smoking status: Current Everyday Smoker -- 1.0 packs/day  . Smokeless tobacco: Not on file  . Alcohol Use: No  . Drug Use: No  . Sexually Active:    Other Topics Concern  . Not on file   Social History Narrative  . No narrative on file   Health Maintenance  Topic Date Due  . Pap Smear  12/25/1995  . Tetanus/tdap  12/24/1996  . Influenza Vaccine  01/05/2012    The following portions of the patient's history were reviewed and updated as appropriate: allergies, current medications, past family history, past medical history, past social history and problem list.  Review of Systems Pertinent items are noted in HPI.   Objective:    General appearance: alert, cooperative and no distress Throat: lips, mucosa, and tongue normal; teeth and gums normal Lungs: clear to auscultation bilaterally Heart: regular rate and rhythm, S1, S2 normal, no murmur, click, rub or gallop Abdomen: soft, non-tender; bowel sounds normal; no masses,  no organomegaly Pelvic: cervix normal in appearance, external genitalia normal, no adnexal masses or tenderness, no  cervical motion tenderness, rectovaginal septum normal, uterus normal size, shape, and consistency and vaginal discharge present Extremities: extremities normal, atraumatic, no cyanosis or edema Skin: Skin color, texture, turgor normal. No rashes or lesions or multiple tattoos on chest and arms    Assessment:    Healthy female exam.    Exposure to tattoos - at risk for Hep C.     Plan:    See Problem List. See After Visit Summary for Counseling Recommendations

## 2011-10-20 NOTE — Assessment & Plan Note (Signed)
Pap, GC/chlamydia performed today.  Patient needs to contact high school for immunizations.  Gave TDAP today.

## 2011-10-20 NOTE — Assessment & Plan Note (Signed)
Will check HIV antibody and RPR.

## 2011-10-20 NOTE — Assessment & Plan Note (Signed)
Will check Hepatitis C antibody reflex today due history of tattoos/high risk for Hep C.

## 2011-10-20 NOTE — Patient Instructions (Addendum)
We will give you Tetanus today. Please follow up with [facility] Immunizations for out of country shots. Return to clinic for biopsy of mole on your back - either with Dr. Tye Savoy or [facility].

## 2011-10-26 ENCOUNTER — Encounter: Payer: Self-pay | Admitting: Family Medicine

## 2011-11-18 ENCOUNTER — Emergency Department (HOSPITAL_COMMUNITY): Payer: Self-pay

## 2011-11-18 ENCOUNTER — Other Ambulatory Visit: Payer: Self-pay

## 2011-11-18 ENCOUNTER — Encounter (HOSPITAL_COMMUNITY): Payer: Self-pay

## 2011-11-18 ENCOUNTER — Emergency Department (HOSPITAL_COMMUNITY)
Admission: EM | Admit: 2011-11-18 | Discharge: 2011-11-18 | Disposition: A | Discharge status: Home / Self Care | Payer: Self-pay | Attending: Emergency Medicine | Admitting: Emergency Medicine

## 2011-11-18 DIAGNOSIS — R0789 Other chest pain: Secondary | ICD-10-CM | POA: Insufficient documentation

## 2011-11-18 DIAGNOSIS — F17200 Nicotine dependence, unspecified, uncomplicated: Secondary | ICD-10-CM | POA: Insufficient documentation

## 2011-11-18 DIAGNOSIS — Z9089 Acquired absence of other organs: Secondary | ICD-10-CM | POA: Insufficient documentation

## 2011-11-18 LAB — CBC WITH DIFFERENTIAL/PLATELET
Eosinophils Absolute: 0.1 10*3/uL (ref 0.0–0.7)
Eosinophils Relative: 1 % (ref 0–5)
HCT: 39.4 % (ref 36.0–46.0)
Hemoglobin: 13.4 g/dL (ref 12.0–15.0)
Lymphs Abs: 2.3 10*3/uL (ref 0.7–4.0)
MCH: 33.1 pg (ref 26.0–34.0)
MCV: 97.3 fL (ref 78.0–100.0)
Monocytes Relative: 7 % (ref 3–12)
RBC: 4.05 MIL/uL (ref 3.87–5.11)

## 2011-11-18 LAB — COMPREHENSIVE METABOLIC PANEL
Alkaline Phosphatase: 58 U/L (ref 39–117)
BUN: 13 mg/dL (ref 6–23)
Calcium: 9 mg/dL (ref 8.4–10.5)
GFR calc Af Amer: >90 mL/min (ref >90)
GFR calc non Af Amer: >90 mL/min (ref >90)
Glucose, Bld: 79 mg/dL (ref 70–99)
Total Protein: 7.1 g/dL (ref 6.0–8.3)

## 2011-11-18 LAB — POCT I-STAT TROPONIN I: Troponin i, poc: 0 ng/mL (ref 0.00–0.08)

## 2011-11-18 MED ORDER — ALPRAZOLAM 0.25 MG PO TABS
0.5000 mg | ORAL_TABLET | Freq: Once | ORAL | Status: AC
  Administered 2011-11-18: 0.5 mg via ORAL
  Filled 2011-11-18: qty 2

## 2011-11-18 NOTE — ED Provider Notes (Signed)
I have supervised the resident with this patient. I personally examined the patient. Basically, this is a 36 yo F with L sided CP for the last 3 days. While driving today, she had another episode that is more severe. Her lung and cardiac exam is nl. Labs showed CBC, CMP nl, Trop neg x 2, low risk for PE. She has a hx of anxiety and felt better after xanax. She is d/c home with pcp f/u.   Richardean Canal, MD 11/18/11 2059

## 2011-11-18 NOTE — ED Provider Notes (Signed)
History     CSN: [id number]  Arrival date & time 11/18/11  1238   First MD Initiated Contact with Patient 11/18/11 1639      Chief Complaint  Patient presents with  . Chest Pain    (Consider location/radiation/quality/duration/timing/severity/associated sxs/prior treatment) Patient is a 36 y.o. female presenting with chest pain. The history is provided by the patient.  Chest Pain The chest pain began yesterday. Chest pain occurs intermittently. The chest pain is resolved. Associated with: unknown. At its most intense, the pain is at 8/10. The pain is currently at 0/10. The quality of the pain is described as sharp (left lower chest). The pain does not radiate. Pertinent negatives for primary symptoms include no fever, no syncope, no shortness of breath, no cough, no wheezing, no palpitations, no abdominal pain, no nausea, no vomiting and no dizziness. Primary symptoms comment: left arm pain, lightheadedness  Pertinent negatives for associated symptoms include no lower extremity edema, no near-syncope, no orthopnea, no paroxysmal nocturnal dyspnea and no weakness. She tried nothing for the symptoms. Risk factors include no known risk factors.  Pertinent negatives for past medical history include no CAD.     Past Medical History  Diagnosis Date  . Lymphadenitis     Past Surgical History  Procedure Date  . Cholecystectomy   . Appendectomy   . Foot surgery   . Tubal ligation     History reviewed. No pertinent family history.  History  Substance Use Topics  . Smoking status: Current Everyday Smoker -- 1.0 packs/day  . Smokeless tobacco: Not on file  . Alcohol Use: No    OB History    Grav Para Term Preterm Abortions TAB SAB Ect Mult Living                  Review of Systems  Constitutional: Negative for fever and chills.  HENT: Negative.   Respiratory: Negative for cough, shortness of breath and wheezing.   Cardiovascular: Positive for chest pain. Negative for  palpitations, orthopnea, syncope and near-syncope.  Gastrointestinal: Negative for nausea, vomiting, abdominal pain, diarrhea and constipation.  Genitourinary: Negative.   Musculoskeletal: Negative.   Skin: Negative.   Neurological: Positive for light-headedness. Negative for dizziness and weakness.  All other systems reviewed and are negative.    Allergies  Cephalexin; Conjugated estrogens; Cyclobenzaprine hcl; Ephedrine sulfate; Rabeprazole sodium; and Sulfonamide derivatives  Home Medications   Current Outpatient Rx  Name Route Sig Dispense Refill  . IBUPROFEN 200 MG PO TABS Oral Take 400 mg by mouth every 6 (six) hours as needed. pain      BP 101/69  Pulse 60  Temp 98 F (36.7 C) (Oral)  Resp 19  SpO2 100%  LMP 11/18/2011  Physical Exam  Nursing note and vitals reviewed. Constitutional: She is oriented to person, place, and time. She appears well-developed and well-nourished. No distress.  HENT:  Head: Normocephalic and atraumatic.  Eyes: Conjunctivae are normal.  Neck: Neck supple.  Cardiovascular: Normal rate, regular rhythm, normal heart sounds and intact distal pulses.  Exam reveals no friction rub.   No murmur heard. Pulmonary/Chest: Effort normal and breath sounds normal. She has no wheezes. She has no rales. She exhibits no tenderness.  Abdominal: Soft. She exhibits no distension. There is no tenderness.  Musculoskeletal: Normal range of motion. She exhibits no edema and no tenderness.  Neurological: She is alert and oriented to person, place, and time.  Skin: Skin is warm and dry.    ED Course  Procedures (including critical care time)   Labs Reviewed  CBC WITH DIFFERENTIAL  COMPREHENSIVE METABOLIC PANEL  POCT I-STAT TROPONIN I  POCT I-STAT TROPONIN I   Dg Chest 2 View  11/18/2011  *RADIOLOGY REPORT*  Clinical Data: Chest pain.  Smoker.  CHEST - 2 VIEW  Comparison: Plain films of the chest 07/22/2007 and 11/02/2010.  CT chest 07/22/2007.  Findings:  The chest is hyperexpanded but the lungs are clear.  No pneumothorax or pleural fluid.  Heart size normal.  IMPRESSION: No acute finding.  Pulmonary hyperexpansion compatible with emphysema.  Original Report Authenticated By: Bernadene Bell. D'ALESSIO, M.D.     1. Atypical chest pain       MDM  36 yo female with no PMHx presents for 1 day history of intermittent sharp lower left sided chest pain.  Chest pain episodes lasted seconds.  Last episode prior to arrival had left sided chest pain with associated left arm pain and lightheadedness.  Pt had one additional episode while sitting in the waiting room.  Has had panic attacks in the past but felt that this was different.  No pain at time of exam.  AF, VSS, NAD.  Physical exam with normal cardiac and pulmonary exams.  CBC, CMP wnl.  Troponin negative.  Description of chest pain very atypical and doubt ACS.  PERC negative, no signs of DVT, and doubt PE.  Will get CXR and give Xanax for anxiety.  CXR showed no acute abnormality.  Delta troponin negative.  Presentation possibly secondary to anxiety.  Recommend close PCP follow-up.  Return precautions provided.        Cherre Robins, MD 11/18/11 2031

## 2011-11-18 NOTE — ED Notes (Addendum)
Pt reports intermittent (L) side chest pain under (L) breast radiating into her (L) arm, SOB, and lightheaded this am. Pt reports cardiac hx. Pt reports increase stress at home

## 2011-11-25 ENCOUNTER — Telehealth: Payer: Self-pay | Admitting: Family Medicine

## 2011-11-25 DIAGNOSIS — K0889 Other specified disorders of teeth and supporting structures: Secondary | ICD-10-CM

## 2011-11-25 NOTE — Telephone Encounter (Signed)
Was put on waiting list for dental clinic and is wondering where she is on the list?  Her tooth is still hurting a lot and keeping her up at night.  pls let her know asap

## 2011-11-25 NOTE — Telephone Encounter (Signed)
Dr Tye Savoy,  This patient does not have a referral in for this and I didn't see any office visit notes in there unless I am missing something. Can a referral be put in?  Huntley Dec

## 2011-11-26 NOTE — Telephone Encounter (Signed)
Patient is calling back because the nurse hasn't called her back yet.  She is very concerned that her tooth may be getting abscessed.

## 2011-11-27 NOTE — Telephone Encounter (Signed)
LVM informing patient about how the dental referral works

## 2011-11-27 NOTE — Telephone Encounter (Signed)
Melissa Allison, I placed another Dental referral.  If she is having worsening pain or fever, then she will need to come in to be evaluated.  If not, she will have to wait to be seen by a dentist.

## 2012-01-06 ENCOUNTER — Telehealth: Payer: Self-pay | Admitting: Family Medicine

## 2012-01-06 NOTE — Telephone Encounter (Signed)
Called and informed pt of meaning of labs. Pt voiced understanding.Melissa Allison

## 2012-01-06 NOTE — Telephone Encounter (Signed)
Patient is calling because she received the letter with her lab results, but she doesn't understand what they mean and would like to speak to someone to explain them.

## 2012-02-22 ENCOUNTER — Ambulatory Visit: Payer: Self-pay

## 2012-04-11 ENCOUNTER — Ambulatory Visit (INDEPENDENT_AMBULATORY_CARE_PROVIDER_SITE_OTHER): Payer: No Typology Code available for payment source | Admitting: Family Medicine

## 2012-04-11 ENCOUNTER — Encounter: Payer: Self-pay | Admitting: Family Medicine

## 2012-04-11 VITALS — BP 103/56 | HR 66 | Temp 98.2°F | Ht 62.0 in | Wt 103.0 lb

## 2012-04-11 DIAGNOSIS — F411 Generalized anxiety disorder: Secondary | ICD-10-CM

## 2012-04-11 DIAGNOSIS — R002 Palpitations: Secondary | ICD-10-CM | POA: Insufficient documentation

## 2012-04-11 LAB — CBC
Hemoglobin: 12.6 g/dL (ref 12.0–15.0)
MCH: 33.2 pg (ref 26.0–34.0)
MCHC: 34.6 g/dL (ref 30.0–36.0)
MCV: 95.8 fL (ref 78.0–100.0)

## 2012-04-11 LAB — COMPREHENSIVE METABOLIC PANEL
Alkaline Phosphatase: 51 U/L (ref 39–117)
BUN: 16 mg/dL (ref 6–23)
Creat: 0.82 mg/dL (ref 0.50–1.10)
Glucose, Bld: 68 mg/dL — ABNORMAL LOW (ref 70–99)
Sodium: 139 mEq/L (ref 135–145)
Total Bilirubin: 0.5 mg/dL (ref 0.3–1.2)
Total Protein: 6.6 g/dL (ref 6.0–8.3)

## 2012-04-11 NOTE — Patient Instructions (Addendum)
Will check labwork today  Will refer you to cardiology to check out your heart's rhythms.  If cardiology does not feel it is heart related, please return to see your primary doctor so you can discuss further evaluation and treatment for palpations.  Consider if medications for anxiety may have a positive risk to benefit in quality of life for you

## 2012-04-11 NOTE — Assessment & Plan Note (Signed)
Symptomatic, but is opposed to SSRI or other medications than can alter brain biochemically.  Advised her to consider quality of life may be worth treatment.  May be a good candidate for CBT when she is ready.

## 2012-04-11 NOTE — Progress Notes (Signed)
  Subjective:    Patient ID: Melissa Allison, female    DOB: May 28, 1975, 37 y.o.   MRN: [id number]  HPI  Here for evaluation for heart issues.- palpitations and lightheaded.  States has had continued similar symptoms starting many years ago, was told she had an aortic dissection.  Had subsequent cardiology evaluation about 7-10 years ago per patient by [facility] which included and EKG and ECHO which was normal.  States also has had EKG of a. Fib in EMS but ER physician said was ok per patient and EKG was bumps in the road.  Admits has history of panic attacks, and this had been a hard year for her emotionally.  Has had diagnosis of General anxiety in the past, but does not like the idea of a daily medicine.  Has been helped before by valium.  Most recent episode: yesterday: Sharp pain across left side of chest for the past few weeks.  Associated with unusual feeling in jaw.  While driving yesterday, heart had "sinking feeling" and jaw felt tight, with nausea.  Pulled over 3 times on the way to pick up her kids as palpitations persisted.  Unsure exactly how long lasts.  Feels heart is beating hard and skipping beats.  Makes her feel dizzy and sick.  Feels weak and tired afterwards.  Can be at rest or when walking.  EMS was called when she arrived to pick up her kids at youth group.  They recommended she see a doctor.  States this does not feel like her typical symptoms of panic attacks.     Reviewed EMR Last saw [facility] [place] cardiology 7-10 years: Had normal EKG and normal ECHO- I am not able to find this in EMR>  CTA of chest 2009 shows no aortic dissection.    Patient reports cousin with history of long QT syndrome.  Patient's recent EKG in August does not show QT prolongation. Review of Systems See HPI    Objective:   Physical Exam  GEN: Alert & Oriented, No acute distress CV:  Regular Rate & Rhythm, no murmur Respiratory:  Normal work of breathing, CTAB Abd:  + BS, soft, no tenderness to  palpation Ext: no pre-tibial edema       Assessment & Plan:

## 2012-04-11 NOTE — Assessment & Plan Note (Signed)
Heart palpitations with negative workup in distant past by cardiology (ECHO, ambulatory monitoring).  Patient does not feel it is due to anxiety.  Advised it would be reasonable to repeat labwork (TSH, CBC, CMET) and refer to cardiology for holter monitor.  I encouraged her to follow-up with PCP after cardiology eval and if cardiac source of palpitations not found, to consider role anxiety may be playing.

## 2012-04-13 ENCOUNTER — Encounter: Payer: Self-pay | Admitting: Family Medicine

## 2012-04-26 ENCOUNTER — Ambulatory Visit: Payer: No Typology Code available for payment source | Admitting: Cardiology

## 2012-04-26 ENCOUNTER — Telehealth: Payer: Self-pay | Admitting: *Deleted

## 2012-04-26 NOTE — Telephone Encounter (Signed)
Melissa Allison with Dr. Jenene Slicker office calling to let us know patient no showed her appointment with them.  I advised [name] that after I referred this patient and tried to call her to give her the appointment, I realized that we did not have any current working phone numbers on this patient.  I did attempt to mail her a letter with the appointment information hoping it would get to her and she would make this appointment, but if we didn't verify her phone numbers, we probably didn't verify her address at her last office visit with [place].   I advised [name] at this point we will just have to wait until patient contacts [place].  [name]

## 2012-05-05 ENCOUNTER — Telehealth: Payer: Self-pay | Admitting: Family Medicine

## 2012-05-05 NOTE — Telephone Encounter (Signed)
Called and spoke with [facility].  I advised her she had actually missed her appointment with the cardiologist.  I informed her we did not have a working  phone number or address on her so I could not contact her about her appointment.  [facility] phone number given to [facility] to call and reschedule her appointment.  Phone number and address updated in chart.  [name]

## 2012-05-05 NOTE — Telephone Encounter (Signed)
Pt is calling about her cardiology appt - no one has told her when it is.  Would like to find out status

## 2012-05-11 ENCOUNTER — Ambulatory Visit: Payer: No Typology Code available for payment source | Admitting: Family Medicine

## 2012-05-16 ENCOUNTER — Encounter: Payer: Self-pay | Admitting: Cardiology

## 2012-05-16 ENCOUNTER — Ambulatory Visit (INDEPENDENT_AMBULATORY_CARE_PROVIDER_SITE_OTHER): Payer: No Typology Code available for payment source | Admitting: Cardiology

## 2012-05-16 VITALS — BP 100/60 | HR 70 | Ht 63.0 in | Wt 105.1 lb

## 2012-05-16 DIAGNOSIS — I7102 Dissection of abdominal aorta: Secondary | ICD-10-CM

## 2012-05-16 DIAGNOSIS — R002 Palpitations: Secondary | ICD-10-CM

## 2012-05-16 NOTE — Patient Instructions (Addendum)
The current medical regimen is effective;  continue present plan and medications.  Your physician has requested that you have an exercise tolerance test. For further information please visit www.[email or web address]. Please also follow instruction sheet, as given.  Your physician has recommended that you wear a holter monitor for 48 hours. Holter monitors are medical devices that record the heart's electrical activity. Doctors most often use these monitors to diagnose arrhythmias. Arrhythmias are problems with the speed or rhythm of the heartbeat. The monitor is a small, portable device. You can wear one while you do your normal daily activities. This is usually used to diagnose what is causing palpitations/syncope (passing out).   

## 2012-05-16 NOTE — Progress Notes (Signed)
HPI The patient presents for evaluation of palpitations and chest discomfort. She's had a history of this in the past. She says she wore a monitor at some point in time but I don't have these records. She mentions being told that she had coronary calcification. I reviewed all available radiologic data and even called radiology and reviewed these films with one of the radiologists. I could see no documentation of coronary calcification. She does have a history of a small abdominal aortic dissection. This was stable on serial exams and she was seen by Dr. Excell Seltzer in 2009. No further imaging was suggested. She's never had any symptoms related to this.  She presents for evaluation of palpitations. She says this has been happening for a couple of years. She reports that she's had very significant stress in her life increasing with the loss of her job, raising 3 sons on her on, losing her home and having to live with her grandmother, having significant issues with at least one of her children. She gets stressed and developed some jaw pain. She developed headache. She developed pain across her shoulder blades. He can go to gym class and do some exercising and not bring on any symptoms. She does get palpitations which happens sporadically. She says these are happening daily. She might feel lightheaded. She's not having any sustained tachypalpitations but rather skipping beats. She does have some shortness of breath but doesn't describe PND or orthopnea. She feels severe anxiety.  Allergies  Allergen Reactions  . Cephalexin     REACTION: tongue swelling  . Conjugated Estrogens     REACTION: unspecified  . Cyclobenzaprine Hcl     REACTION: unspecified  . Ephedrine Sulfate   . Rabeprazole Sodium     REACTION: unspecified  . Sulfonamide Derivatives     REACTION: unspecified    Current Outpatient Prescriptions  Medication Sig Dispense Refill  . ibuprofen (ADVIL,MOTRIN) 200 MG tablet Take 400 mg by mouth  every 6 (six) hours as needed. pain       No current facility-administered medications for this visit.    Past Medical History  Diagnosis Date  . Lymphadenitis   . Anxiety     Past Surgical History  Procedure Laterality Date  . Cholecystectomy    . Appendectomy    . Foot surgery    . Tubal ligation    . Uterine fibroid surgery      cauterization of aneurysm    Family History  Problem Relation Age of Onset  . Cancer Father     prostate  . Heart disease Maternal Grandmother     unsure    History   Social History  . Marital Status: Divorced    Spouse Name: N/A    Number of Children: N/A  . Years of Education: N/A   Occupational History  . Not on file.   Social History Main Topics  . Smoking status: Current Some Day Smoker -- 1.00 packs/day  . Smokeless tobacco: Not on file     Comment: Patient using E cigarette  . Alcohol Use: No  . Drug Use: No  . Sexually Active: Not on file   Other Topics Concern  . Not on file   Social History Narrative  . No narrative on file    ROS:   Positive for jaw pain, diarrhea, varicose veins.  Otherwise as stated in the HPI and negative for all other systems.  PHYSICAL EXAM BP 100/60  Pulse 70  Ht 5\' 3"  (  1.6 m)  Wt 105 lb 1.9 oz (47.682 kg)  BMI 18.63 kg/m2 GENERAL:  Well appearing HEENT:  Pupils equal round and reactive, fundi not visualized, oral mucosa unremarkable NECK:  No jugular venous distention, waveform within normal limits, carotid upstroke brisk and symmetric, no bruits, no thyromegaly LYMPHATICS:  No cervical, inguinal adenopathy LUNGS:  Clear to auscultation bilaterally BACK:  No CVA tenderness CHEST:  Unremarkable HEART:  PMI not displaced or sustained,S1 and S2 within normal limits, no S3, no S4, no clicks, no rubs, no murmurs ABD:  Flat, positive bowel sounds normal in frequency in pitch, no bruits, no rebound, no guarding, no midline pulsatile mass, no hepatomegaly, no splenomegaly EXT:  2 plus pulses  throughout, no edema, no cyanosis no clubbing SKIN:  No rashes no nodules NEURO:  Cranial nerves II through XII grossly intact, motor grossly intact throughout PSYCH:  Cognitively intact, oriented to person place and time  EKG:   Sinus rhythm, rate 70, sinus arrhythmia, axis within limits, intervals within normal limits, no acute ST-T wave changes. 05/16/2012  ASSESSMENT AND PLAN  Chest pain - I could not verify a history of coronary calcification. I reviewed her previous CT with radiology. This was not the correct protocol for this but none was identified. I'm not sure where this diagnosis comes from. Regardless I think that point here is to rule out obstructive coronary disease. I would like to try to avoid further radiation unnecessarily for this young woman. I think the pretest probability of obstructive disease is low. I will bring the patient back for a POET (Plain Old Exercise Test). This will allow me to screen for obstructive coronary disease, risk stratify and very importantly provide a prescription for exercise.  Palpitations - Since these are occurring daily I will place a Holter monitor to further evaluate.  Aortic dissection - I again had radiology review this previous film. I did find a distant office appointment when the patient saw Dr. Excell Seltzer.  No this is a vague finding on CT it does seem to be real. However, it was stable on serial examinations. She's had no symptoms related to this. Therefore, no further imaging is suggested.  Anxiety - She was apparently prescribed an antidepressant by her primary provider but she didn't want to take this. She wanted rather to take something as needed for anxiety. I discussed this with her and encouraged followup with her primary provider.

## 2012-05-17 ENCOUNTER — Telehealth: Payer: Self-pay | Admitting: *Deleted

## 2012-05-17 ENCOUNTER — Encounter (INDEPENDENT_AMBULATORY_CARE_PROVIDER_SITE_OTHER): Payer: No Typology Code available for payment source

## 2012-05-17 DIAGNOSIS — R002 Palpitations: Secondary | ICD-10-CM

## 2012-05-17 NOTE — Telephone Encounter (Signed)
48 hr holter monitor placed on Pt 05/17/12 TK 

## 2012-05-18 ENCOUNTER — Ambulatory Visit: Payer: No Typology Code available for payment source | Admitting: Family Medicine

## 2012-05-21 ENCOUNTER — Emergency Department (HOSPITAL_COMMUNITY): Payer: No Typology Code available for payment source

## 2012-05-21 ENCOUNTER — Emergency Department (HOSPITAL_COMMUNITY)
Admission: EM | Admit: 2012-05-21 | Discharge: 2012-05-21 | Disposition: A | Discharge status: Home Health | Payer: No Typology Code available for payment source | Attending: Emergency Medicine | Admitting: Emergency Medicine

## 2012-05-21 ENCOUNTER — Encounter (HOSPITAL_COMMUNITY): Payer: Self-pay | Admitting: *Deleted

## 2012-05-21 DIAGNOSIS — Z862 Personal history of diseases of the blood and blood-forming organs and certain disorders involving the immune mechanism: Secondary | ICD-10-CM | POA: Insufficient documentation

## 2012-05-21 DIAGNOSIS — Z8659 Personal history of other mental and behavioral disorders: Secondary | ICD-10-CM | POA: Insufficient documentation

## 2012-05-21 DIAGNOSIS — F17200 Nicotine dependence, unspecified, uncomplicated: Secondary | ICD-10-CM | POA: Insufficient documentation

## 2012-05-21 DIAGNOSIS — R42 Dizziness and giddiness: Secondary | ICD-10-CM | POA: Insufficient documentation

## 2012-05-21 DIAGNOSIS — R0602 Shortness of breath: Secondary | ICD-10-CM | POA: Insufficient documentation

## 2012-05-21 DIAGNOSIS — R0789 Other chest pain: Secondary | ICD-10-CM

## 2012-05-21 DIAGNOSIS — R002 Palpitations: Secondary | ICD-10-CM | POA: Insufficient documentation

## 2012-05-21 DIAGNOSIS — R6884 Jaw pain: Secondary | ICD-10-CM | POA: Insufficient documentation

## 2012-05-21 LAB — CBC
HCT: 38.3 % (ref 36.0–46.0)
Hemoglobin: 13.1 g/dL (ref 12.0–15.0)
MCH: 33.9 pg (ref 26.0–34.0)
MCHC: 34.2 g/dL (ref 30.0–36.0)
MCV: 99.2 fL (ref 78.0–100.0)
Platelets: 237 10*3/uL (ref 150–400)
RBC: 3.86 MIL/uL — ABNORMAL LOW (ref 3.87–5.11)
RDW: 13.3 % (ref 11.5–15.5)
WBC: 7 K/uL (ref 4.0–10.5)

## 2012-05-21 LAB — BASIC METABOLIC PANEL WITH GFR
BUN: 14 mg/dL (ref 6–23)
CO2: 29 meq/L (ref 19–32)
Chloride: 100 meq/L (ref 96–112)
Creatinine, Ser: 0.65 mg/dL (ref 0.50–1.10)
GFR calc Af Amer: >90 mL/min (ref >90)
Glucose, Bld: 80 mg/dL (ref 70–99)
Potassium: 3.7 meq/L (ref 3.5–5.1)

## 2012-05-21 LAB — BASIC METABOLIC PANEL
Calcium: 9 mg/dL (ref 8.4–10.5)
GFR calc non Af Amer: >90 mL/min (ref >90)
Sodium: 135 mEq/L (ref 135–145)

## 2012-05-21 LAB — POCT I-STAT TROPONIN I: Troponin i, poc: 0.01 ng/mL (ref 0.00–0.08)

## 2012-05-21 NOTE — ED Notes (Signed)
Pt discharged to home with family. NAD.  

## 2012-05-21 NOTE — ED Notes (Signed)
Pt reports for the past couple of days she has had some chest tightness and jaw pain. Also reports some SOB with the pain. Reports she was seen by Cardiology this past week but was cleared there. Nothing seems to help the pain or make it better.

## 2012-05-23 NOTE — ED Provider Notes (Signed)
History     CSN: [id number]  Arrival date & time 05/21/12  1517   First MD Initiated Contact with Patient 05/21/12 1652      Chief Complaint  Patient presents with  . Chest Pain  . Jaw Pain    (Consider location/radiation/quality/duration/timing/severity/associated sxs/prior treatment) HPI Comments: Patient is a 37 y.o. female presenting with chest tightness and palpations Severity: severe Onset quality: acute Duration: months  Timing: comes and goes  Progression: Unchanged  Relieved by: nothing Worsened by: Nothing tried  Ineffective treatments: None tried Associated sx: dizziness, jaw pain Denies nausea, vomiting, scalp tenderness, visual disturbances, headache, numbness, loss of bladder/bowel control, loss of balance.   Patient is a 37 y.o. female presenting with chest pain.  Chest Pain Associated symptoms: dizziness, palpitations and shortness of breath   Associated symptoms: no diaphoresis, no dysphagia, no fever, no headache, no nausea, no numbness, not vomiting and no weakness     Past Medical History  Diagnosis Date  . Lymphadenitis   . Anxiety     Past Surgical History  Procedure Laterality Date  . Cholecystectomy    . Appendectomy    . Foot surgery    . Tubal ligation    . Uterine fibroid surgery      cauterization of aneurysm    Family History  Problem Relation Age of Onset  . Cancer Father     prostate  . Heart disease Maternal Grandmother     unsure    History  Substance Use Topics  . Smoking status: Current Some Day Smoker -- 1.00 packs/day  . Smokeless tobacco: Not on file     Comment: Patient using E cigarette  . Alcohol Use: No    OB History   Grav Para Term Preterm Abortions TAB SAB Ect Mult Living                  Review of Systems  Constitutional: Negative for fever and diaphoresis.  HENT: Negative for ear pain, trouble swallowing, neck pain and neck stiffness.        Jaw pain on right side  Eyes: Negative for visual  disturbance.  Respiratory: Positive for chest tightness and shortness of breath. Negative for apnea.   Cardiovascular: Positive for chest pain and palpitations.  Gastrointestinal: Negative for nausea, vomiting, diarrhea and constipation.  Genitourinary: Negative for dysuria.  Musculoskeletal: Negative for gait problem.  Skin: Negative for rash.  Neurological: Positive for dizziness. Negative for weakness, light-headedness, numbness and headaches.    Allergies  Cephalexin; Conjugated estrogens; Cyclobenzaprine hcl; Ephedrine sulfate; Rabeprazole sodium; and Sulfonamide derivatives  Home Medications   Current Outpatient Rx  Name  Route  Sig  Dispense  Refill  . ibuprofen (ADVIL,MOTRIN) 200 MG tablet   Oral   Take 400 mg by mouth every 6 (six) hours as needed. pain           BP 101/60  Pulse 64  Temp(Src) 98 F (36.7 C) (Oral)  Resp 20  SpO2 98%  LMP 03/28/2012  Physical Exam  Nursing note and vitals reviewed. Constitutional: She is oriented to person, place, and time. She appears well-developed and well-nourished. No distress.  HENT:  Head: Normocephalic and atraumatic.  Mouth/Throat: No dental abscesses or edematous. No oropharyngeal exudate, posterior oropharyngeal edema, posterior oropharyngeal erythema or tonsillar abscesses.  No scalp tenderness  Eyes: EOM are normal. Pupils are equal, round, and reactive to light.  Neck: Normal range of motion. Neck supple.  No meningeal signs  Cardiovascular: Normal  rate, regular rhythm and normal heart sounds.  Exam reveals no gallop and no friction rub.   No murmur heard. Pulmonary/Chest: Effort normal and breath sounds normal. No respiratory distress. She has no wheezes. She has no rales. She exhibits no tenderness.  Abdominal: Soft. Bowel sounds are normal. She exhibits no distension. There is no tenderness. There is no rebound and no guarding.  Musculoskeletal: Normal range of motion. She exhibits no edema and no tenderness.   Neurological: She is alert and oriented to person, place, and time. No cranial nerve deficit.  Skin: Skin is warm and dry. She is not diaphoretic. No erythema.    ED Course  Procedures (including critical care time)  Labs Reviewed  CBC - Abnormal; Notable for the following:    RBC 3.86 (*)    All other components within normal limits  BASIC METABOLIC PANEL  POCT I-STAT TROPONIN I   Dg Chest 2 View  05/21/2012  *RADIOLOGY REPORT*  Clinical Data: Shortness of breath, chest pain  CHEST - 2 VIEW  Comparison: 11/18/2011  Findings: Mild hyperexpansion, stable. Heart and mediastinal contours are within normal limits.  No focal opacities or effusions.  No acute bony abnormality.  IMPRESSION: No active cardiopulmonary disease.  Stable mild hyperexpansion.   Original Report Authenticated By: Charlett Nose, M.D.      1. Chest pain, non-cardiac       MDM  Pt demanded explanation for chronic symptoms stating she is not getting the answers she wants from her cardiologist. Has Halter monitor results pending, stress test scheduled. Review of notes from cardiologist reveal pt has hx of small abdominal aortic aneurism, stable and asymptomatic, and unconcerning at this time. His clinical suspicion for blockage is low, but stress test is pending before further evaluation. Discussed with pt importance of following up with cardiologist and PCP. Reviewed results with pt who understood that lab work, imaging, and EKG were unremarkable.  At this time there does not appear to be any evidence of an acute emergency medical condition and the patient appears stable for discharge with appropriate outpatient follow up. Diagnosis was discussed with patient who verbalizes understanding and is agreeable to discharge. Pt case discussed with Dr. Oletta Lamas who agrees with my plan.   Glade Nurse, PA-C 05/23/12 1237

## 2012-05-25 NOTE — ED Provider Notes (Signed)
Medical screening examination/treatment/procedure(s) were conducted as a shared visit with non-physician practitioner(s) and myself.  I personally evaluated the patient during the encounter  ECG shows NSR, normal axis, intervals, and no ischemic ST or T wave abns.  Pt is under care of cardiologist, is slated for a stress test.  Pt deemed low risk in their opinion based upon review of CHF clinic notes.  Pt with long standing histroy of same with no clear diagnosis.  Doubt this is any different and with neg troponin here, no concerns for PE, dissection, reassurance provided and pt instructed to follow up with her cardiologist.      Gavin Pound. Davia Smyre, MD 05/25/12 1103

## 2012-05-30 ENCOUNTER — Ambulatory Visit: Payer: No Typology Code available for payment source | Admitting: Cardiology

## 2012-05-31 ENCOUNTER — Encounter: Payer: Self-pay | Admitting: Cardiology

## 2012-06-01 ENCOUNTER — Encounter: Payer: No Typology Code available for payment source | Admitting: Physician Assistant

## 2012-06-15 ENCOUNTER — Ambulatory Visit (INDEPENDENT_AMBULATORY_CARE_PROVIDER_SITE_OTHER): Payer: No Typology Code available for payment source | Admitting: Physician Assistant

## 2012-06-15 DIAGNOSIS — R002 Palpitations: Secondary | ICD-10-CM

## 2012-06-15 NOTE — Progress Notes (Signed)
Exercise Treadmill Test  Pre-Exercise Testing Evaluation Rhythm: normal sinus  Rate: 57                 Test  Exercise Tolerance Test Ordering MD: Angelina Sheriff, MD  Interpreting MD: Tereso Newcomer, PA-C  Unique Test No: 1  Treadmill:  1  Indication for ETT: Palpitations  Contraindication to ETT: No   Stress Modality: exercise - treadmill  Cardiac Imaging Performed: non   Protocol: standard Bruce - maximal  Max BP:  150/64  Max MPHR (bpm):  186 85% MPR (bpm):  158  MPHR obtained (bpm):  162 % MPHR obtained:  87%  Reached 85% MPHR (min:sec):  9:58 Total Exercise Time (min-sec):  10:30  Workload in METS:  12.4 Borg Scale: 19  Reason ETT Terminated:  patient's desire to stop    ST Segment Analysis At Rest: normal ST segments - no evidence of significant ST depression With Exercise: no evidence of significant ST depression  Other Information Arrhythmia:  Rare PVC at max stress Angina during ETT:  absent (0) Quality of ETT:  diagnostic  ETT Interpretation:  normal - no evidence of ischemia by ST analysis  Comments: Good exercise tolerance. No chest pain. Normal BP response to exercise. No ST-T changes to suggest ischemia.  Good HR recovery in 1st minute post exercise.  Recommendations: Follow up with Dr. Rollene Rotunda as directed. Signed. Tereso Newcomer, PA-C  11:26 AM 06/15/2012

## 2012-06-28 ENCOUNTER — Telehealth: Payer: Self-pay

## 2012-06-28 ENCOUNTER — Telehealth: Payer: Self-pay | Admitting: Cardiology

## 2012-06-28 NOTE — Telephone Encounter (Signed)
[facility] CALLED PATIENT ABOUT MONITOR RESULTS

## 2012-06-28 NOTE — Telephone Encounter (Signed)
New Problem:    Patient called in returning your call. Please call back. 

## 2012-06-29 NOTE — Telephone Encounter (Signed)
Follow-up:    Patient is calling in about receiving her monitor results.  Please call back.

## 2012-06-29 NOTE — Telephone Encounter (Signed)
Pt aware of results of monitor  NSR

## 2012-07-21 ENCOUNTER — Encounter: Payer: Self-pay | Admitting: Cardiology

## 2012-07-27 ENCOUNTER — Encounter: Payer: No Typology Code available for payment source | Admitting: Family Medicine

## 2012-08-12 ENCOUNTER — Encounter: Payer: Self-pay | Admitting: Family Medicine

## 2012-08-12 ENCOUNTER — Ambulatory Visit (INDEPENDENT_AMBULATORY_CARE_PROVIDER_SITE_OTHER): Payer: No Typology Code available for payment source | Admitting: Family Medicine

## 2012-08-12 ENCOUNTER — Other Ambulatory Visit (HOSPITAL_COMMUNITY)
Admission: RE | Admit: 2012-08-12 | Discharge: 2012-08-12 | Disposition: A | Discharge status: Home / Self Care | Payer: No Typology Code available for payment source | Source: Ambulatory Visit | Attending: Family Medicine | Admitting: Family Medicine

## 2012-08-12 VITALS — BP 100/51 | HR 69 | Temp 98.3°F | Ht 63.0 in | Wt 105.0 lb

## 2012-08-12 DIAGNOSIS — Z113 Encounter for screening for infections with a predominantly sexual mode of transmission: Secondary | ICD-10-CM | POA: Insufficient documentation

## 2012-08-12 DIAGNOSIS — Z01419 Encounter for gynecological examination (general) (routine) without abnormal findings: Secondary | ICD-10-CM | POA: Insufficient documentation

## 2012-08-12 DIAGNOSIS — Z1151 Encounter for screening for human papillomavirus (HPV): Secondary | ICD-10-CM | POA: Insufficient documentation

## 2012-08-12 DIAGNOSIS — F411 Generalized anxiety disorder: Secondary | ICD-10-CM

## 2012-08-12 DIAGNOSIS — Z124 Encounter for screening for malignant neoplasm of cervix: Secondary | ICD-10-CM

## 2012-08-12 DIAGNOSIS — N760 Acute vaginitis: Secondary | ICD-10-CM

## 2012-08-12 MED ORDER — FLUOXETINE HCL 20 MG PO TABS: 20.0000 mg | ORAL_TABLET | Freq: Every day | ORAL | Status: DC

## 2012-08-12 NOTE — Assessment & Plan Note (Signed)
Advised patient to stop taking her friend's Valium or Xanax.  Will start Prozac 20 mg for 4-6 weeks and increase to 40 mg if needed.  Patient to call me if she does not see improvement in anxiety symptoms.  May consider referral for CBT in addition to SSRI.

## 2012-08-12 NOTE — Assessment & Plan Note (Signed)
Pap performed today.  Will notify of results.

## 2012-08-12 NOTE — Progress Notes (Signed)
  Subjective:     Melissa Allison is a 37 y.o. female and is here for a comprehensive physical exam. The patient reports worsening anxiety.  Described as difficulty sleeping due to racing thoughts.  She says she flew to [facility] last week and had a panic attack on the plane.  Patient admits to taking her friend's Valium and Xanax at times, and wants something for her anxiety.  She is aware that Xanax is not the best treatment due its harmful side effects.  She denies any depressed mood, change in appetite, loss of interest in activities.  She denies any SI/HI.  She does have difficulty sleeping.  Patient also wants to have pap smear performed.  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  History   Social History  . Marital Status: Divorced    Spouse Name: N/A    Number of Children: 3  . Years of Education: N/A   Occupational History  . Not on file.   Social History Main Topics  . Smoking status: Current Some Day Smoker -- 0.25 packs/day for 10 years  . Smokeless tobacco: Not on file     Comment: Patient using E cigarette  . Alcohol Use: Yes     Comment: twice per month  . Drug Use: No  . Sexually Active: Yes    Birth Control/ Protection: Surgical   Other Topics Concern  . Not on file   Social History Narrative   Lives in [place] - with 3 sons.     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   Health Maintenance  Topic Date Due  . Influenza Vaccine  12/05/2012  . Pap Smear  10/20/2014  . Tetanus/tdap  10/19/2021    The following portions of the patient's history were reviewed and updated as appropriate: allergies, current medications, past family history, past medical history, past social history, past surgical history and problem list.  Review of Systems Pertinent items are noted in HPI.   Objective:    General appearance: alert, cooperative and thin Throat: lips, mucosa, and tongue normal; teeth  and gums normal Lungs: clear to auscultation bilaterally Heart: regular rate and rhythm, S1, S2 normal, no murmur, click, rub or gallop Abdomen: soft, non-tender; bowel sounds normal; no masses,  no organomegaly Skin: no rash, she has several tattoos and piercings Neurologic: Grossly normal  Pelvic: normal thin vaginal discharge, normal uterus and cervix; no palpated masses or tenderness on bimanual exam  Assessment:    Healthy female exam with pap.  GAD.     Plan:    See Problem List.

## 2012-08-12 NOTE — Patient Instructions (Signed)
It was good to see you again. We will send you a letter with your recent lab results. For anxiety, start taking Prozac DAILY.  If you do not notice any improvement in mood, please return to clinic in 6 weeks. Schedule your next physical in one year.  Anxiety and Panic Attacks Your caregiver has informed you that you are having an anxiety or panic attack. There may be many forms of this. Most of the time these attacks come suddenly and without warning. They come at any time of day, including periods of sleep, and at any time of life. They may be strong and unexplained. Although panic attacks are very scary, they are physically harmless. Sometimes the cause of your anxiety is not known. Anxiety is a protective mechanism of the body in its fight or flight mechanism. Most of these perceived danger situations are actually nonphysical situations (such as anxiety over losing a job). CAUSES  The causes of an anxiety or panic attack are many. Panic attacks may occur in otherwise healthy people given a certain set of circumstances. There may be a genetic cause for panic attacks. Some medications may also have anxiety as a side effect. SYMPTOMS  Some of the most common feelings are:  Intense terror.  Dizziness, feeling faint.  Hot and cold flashes.  Fear of going crazy.  Feelings that nothing is real.  Sweating.  Shaking.  Chest pain or a fast heartbeat (palpitations).  Smothering, choking sensations.  Feelings of impending doom and that death is near.  Tingling of extremities, this may be from over-breathing.  Altered reality (derealization).  Being detached from yourself (depersonalization). Several symptoms can be present to make up anxiety or panic attacks. DIAGNOSIS  The evaluation by your caregiver will depend on the type of symptoms you are experiencing. The diagnosis of anxiety or panic attack is made when no physical illness can be determined to be a cause of the  symptoms. TREATMENT  Treatment to prevent anxiety and panic attacks may include:  Avoidance of circumstances that cause anxiety.  Reassurance and relaxation.  Regular exercise.  Relaxation therapies, such as yoga.  Psychotherapy with a psychiatrist or therapist.  Avoidance of caffeine, alcohol and illegal drugs.  Prescribed medication. SEEK IMMEDIATE MEDICAL CARE IF:   You experience panic attack symptoms that are different than your usual symptoms.  You have any worsening or concerning symptoms. Document Released: 03/23/2005 Document Revised: 06/15/2011 Document Reviewed: 07/25/2009 [facility] Patient Information 2013 [place], [place].

## 2012-08-15 ENCOUNTER — Encounter: Payer: Self-pay | Admitting: Family Medicine

## 2012-12-11 ENCOUNTER — Emergency Department (HOSPITAL_COMMUNITY)
Admission: EM | Admit: 2012-12-11 | Discharge: 2012-12-11 | Disposition: A | Discharge status: Home / Self Care | Payer: Medicaid Other | Source: Home / Self Care | Attending: Emergency Medicine | Admitting: Emergency Medicine

## 2012-12-11 ENCOUNTER — Encounter (HOSPITAL_COMMUNITY): Payer: Self-pay | Admitting: *Deleted

## 2012-12-11 DIAGNOSIS — F419 Anxiety disorder, unspecified: Secondary | ICD-10-CM

## 2012-12-11 DIAGNOSIS — F411 Generalized anxiety disorder: Secondary | ICD-10-CM

## 2012-12-11 DIAGNOSIS — K047 Periapical abscess without sinus: Secondary | ICD-10-CM

## 2012-12-11 MED ORDER — CLINDAMYCIN HCL 300 MG PO CAPS: 300.0000 mg | ORAL_CAPSULE | Freq: Four times a day (QID) | ORAL | Status: DC

## 2012-12-11 MED ORDER — LORAZEPAM 1 MG PO TABS: 1.0000 mg | ORAL_TABLET | Freq: Three times a day (TID) | ORAL | Status: DC | PRN

## 2012-12-11 NOTE — ED Provider Notes (Signed)
Chief Complaint:   Chief Complaint  Patient presents with  . Anxiety    History of Present Illness:   Melissa Allison is a 37 year old female who comes in today because of anxiety. Patient states that she feels stressed out. She is trying to work and take care of 4 teenage sons without any help. Her family is of no help and her husband not in the picture. They're very stressful to her at home. Because of this she feels nervous and anxious. Has not had any panic attacks. She can't sleep and has trouble focusing. She feels overwhelmed at times and her mind races. Sometimes she has numbness and tingling in her extremities, muscle tightness, tremulousness, she feels irritable, and her jaws feel tight. She denies any depression, suicidal thoughts, homicidal thoughts, hallucinations, or delusions. Her only other complaint has been intermittent toothache in her right lower second molar which has been going on off and on for months.  Review of Systems:  Other than noted above, the patient denies any of the following symptoms: Systemic:  No fever, chills, sweats, fatigue, weight loss or gain. Resp:  No shortness of breath. Cardiovasc:  No chest pain, tightness, pressure, palpitations, dizziness, or syncope. GI:  No abdominal pain, nausea, vomiting, anorexia, diarrhea, or constipation. Neuro:  No headache, paresthesias, tremor, or muscle weakness. Psych:  No sadness, depression, crying, anxiety, panic, sleep disturbance, or suicidal or homicidal ideation.  No hallucinations or delusions.  PMFSH:  Past medical history, family history, social history, meds, and allergies were reviewed.  She is allergic to sulfa.  Physical Exam:   Vital signs:  BP 121/74  Pulse 60  Temp(Src) 98.6 F (37 C) (Oral)  Resp 20  SpO2 100%  LMP 11/28/2012 Gen:  Alert, oriented, in no distress. ENT: Her right, lower, second molar was partially broken off and decayed. Lungs:  No respiratory distress.  Breath sounds clear and  equal bilaterally.  No wheezes, rales, or rhonchi. Heart:  Regular rthythm.  No gallops, murmers, clicks or rubs. Abdomen:  Soft, flat and nontender.  No organomegaly or mass. Neuro:  Alert and oriented times 3. Speech clear, fluent and appropriate.  Cranial nerves intact.  No focal weakness. Psych:  Mood and affect normal.  Speech pattern normal.  Thought content normal with no suicidal or homicidal ideation.  No paranoia, hallucinations, or delusions.  Memory, insight, and judgement normal.  Assessment:  The primary encounter diagnosis was Anxiety. A diagnosis of Dental abscess was also pertinent to this visit.   She'll need psychiatric followup. Does not have primary care physician.  Plan:   1.  The following meds were prescribed:   Discharge Medication List as of 12/11/2012  3:31 PM    START taking these medications   Details  clindamycin (CLEOCIN) 300 MG capsule Take 1 capsule (300 mg total) by mouth 4 (four) times daily., Starting 12/11/2012, Until Discontinued, Normal    LORazepam (ATIVAN) 1 MG tablet Take 1 tablet (1 mg total) by mouth 3 (three) times daily as needed for anxiety., Starting 12/11/2012, Until Discontinued, Print       2.  The patient was instructed in symptomatic care and handouts were given. 3.  The patient was told to return if becoming worse in any way, if no better in 3 or 4 days, and given some red flag symptoms including worsening symptoms or suicidal ideation that would indicate earlier return. 4.  Follow up with Vesta Mixer on [employer] next week. She was given enough medication  to last until that.    Reuben Likes, MD 12/11/12 1640

## 2012-12-11 NOTE — ED Notes (Signed)
Record review

## 2012-12-11 NOTE — ED Notes (Signed)
Pt  Reports  Multiple  Stressors  To include insomnia        Tingling in the  l  Arm  And  Pain l  Jaw            With  The  Symptoms  X  sev  Weeks   The  Pt  Reports  New  Job and  Domestic  Issues  As  Well

## 2012-12-11 NOTE — ED Notes (Signed)
Rx  For   Lorazepam  Given to pt       -  Anti  Biotic  Sent  By  E  rx

## 2013-11-28 ENCOUNTER — Emergency Department (HOSPITAL_COMMUNITY): Payer: Medicaid Other

## 2013-11-28 ENCOUNTER — Emergency Department (HOSPITAL_COMMUNITY)
Admission: EM | Admit: 2013-11-28 | Discharge: 2013-11-28 | Disposition: A | Discharge status: Home / Self Care | Payer: Medicaid Other | Attending: Emergency Medicine | Admitting: Emergency Medicine

## 2013-11-28 ENCOUNTER — Encounter (HOSPITAL_COMMUNITY): Payer: Self-pay | Admitting: Emergency Medicine

## 2013-11-28 DIAGNOSIS — R4182 Altered mental status, unspecified: Secondary | ICD-10-CM | POA: Insufficient documentation

## 2013-11-28 DIAGNOSIS — F17200 Nicotine dependence, unspecified, uncomplicated: Secondary | ICD-10-CM | POA: Insufficient documentation

## 2013-11-28 DIAGNOSIS — F411 Generalized anxiety disorder: Secondary | ICD-10-CM | POA: Diagnosis not present

## 2013-11-28 DIAGNOSIS — Z79899 Other long term (current) drug therapy: Secondary | ICD-10-CM | POA: Diagnosis not present

## 2013-11-28 DIAGNOSIS — Z862 Personal history of diseases of the blood and blood-forming organs and certain disorders involving the immune mechanism: Secondary | ICD-10-CM | POA: Insufficient documentation

## 2013-11-28 DIAGNOSIS — R002 Palpitations: Secondary | ICD-10-CM | POA: Insufficient documentation

## 2013-11-28 DIAGNOSIS — F410 Panic disorder [episodic paroxysmal anxiety] without agoraphobia: Secondary | ICD-10-CM

## 2013-11-28 LAB — CBC
HEMATOCRIT: 40 % (ref 36.0–46.0)
HEMOGLOBIN: 14.1 g/dL (ref 12.0–15.0)
MCH: 34.6 pg — ABNORMAL HIGH (ref 26.0–34.0)
MCHC: 35.3 g/dL (ref 30.0–36.0)
MCV: 98.3 fL (ref 78.0–100.0)
PLATELETS: 251 10*3/uL (ref 150–400)
RBC: 4.07 MIL/uL (ref 3.87–5.11)
RDW: 13.2 % (ref 11.5–15.5)
WBC: 9.3 10*3/uL (ref 4.0–10.5)

## 2013-11-28 LAB — BASIC METABOLIC PANEL
ANION GAP: 11 (ref 5–15)
BUN: 14 mg/dL (ref 6–23)
CHLORIDE: 102 meq/L (ref 96–112)
CO2: 25 meq/L (ref 19–32)
CREATININE: 0.75 mg/dL (ref 0.50–1.10)
Calcium: 8.8 mg/dL (ref 8.4–10.5)
GFR calc Af Amer: >90 mL/min (ref >90)
GFR calc non Af Amer: >90 mL/min (ref >90)
GLUCOSE: 90 mg/dL (ref 70–99)
Potassium: 4.2 mEq/L (ref 3.7–5.3)
Sodium: 138 mEq/L (ref 137–147)

## 2013-11-28 LAB — I-STAT TROPONIN, ED
TROPONIN I, POC: 0 ng/mL (ref 0.00–0.08)
Troponin i, poc: 0 ng/mL (ref 0.00–0.08)

## 2013-11-28 MED ORDER — CLONAZEPAM 0.5 MG PO TABS: 0.5000 mg | ORAL_TABLET | Freq: Two times a day (BID) | ORAL | Status: DC | PRN

## 2013-11-28 NOTE — Discharge Instructions (Signed)
Panic Attacks Panic attacks are sudden, short-livedsurges of severe anxiety, fear, or discomfort. They may occur for no reason when you are relaxed, when you are anxious, or when you are sleeping. Panic attacks may occur for a number of reasons:   Healthy people occasionally have panic attacks in extreme, life-threatening situations, such as war or natural disasters. Normal anxiety is a protective mechanism of the body that helps [place] react to danger (fight or flight response).  Panic attacks are often seen with anxiety disorders, such as panic disorder, social anxiety disorder, generalized anxiety disorder, and phobias. Anxiety disorders cause excessive or uncontrollable anxiety. They may interfere with your relationships or other life activities.  Panic attacks are sometimes seen with other mental illnesses, such as depression and posttraumatic stress disorder.  Certain medical conditions, prescription medicines, and drugs of abuse can cause panic attacks. SYMPTOMS  Panic attacks start suddenly, peak within 20 minutes, and are accompanied by four or more of the following symptoms:  Pounding heart or fast heart rate (palpitations).  Sweating.  Trembling or shaking.  Shortness of breath or feeling smothered.  Feeling choked.  Chest pain or discomfort.  Nausea or strange feeling in your stomach.  Dizziness, light-headedness, or feeling like you will faint.  Chills or hot flushes.  Numbness or tingling in your lips or hands and feet.  Feeling that things are not real or feeling that you are not yourself.  Fear of losing control or going crazy.  Fear of dying. Some of these symptoms can mimic serious medical conditions. For example, you may think you are having a heart attack. Although panic attacks can be very scary, they are not life threatening. DIAGNOSIS  Panic attacks are diagnosed through an assessment by your health care provider. Your health care provider will ask  questions about your symptoms, such as where and when they occurred. Your health care provider will also ask about your medical history and use of alcohol and drugs, including prescription medicines. Your health care provider may order blood tests or other studies to rule out a serious medical condition. Your health care provider may refer you to a mental health professional for further evaluation. TREATMENT   Most healthy people who have one or two panic attacks in an extreme, life-threatening situation will not require treatment.  The treatment for panic attacks associated with anxiety disorders or other mental illness typically involves counseling with a mental health professional, medicine, or a combination of both. Your health care provider will help determine what treatment is best for you.  Panic attacks due to physical illness usually go away with treatment of the illness. If prescription medicine is causing panic attacks, talk with your health care provider about stopping the medicine, decreasing the dose, or substituting another medicine.  Panic attacks due to alcohol or drug abuse go away with abstinence. Some adults need professional help in order to stop drinking or using drugs. HOME CARE INSTRUCTIONS   Take all medicines as directed by your health care provider.   Schedule and attend follow-up visits as directed by your health care provider. It is important to keep all your appointments. SEEK MEDICAL CARE IF:  You are not able to take your medicines as prescribed.  Your symptoms do not improve or get worse. SEEK IMMEDIATE MEDICAL CARE IF:   You experience panic attack symptoms that are different than your usual symptoms.  You have serious thoughts about hurting yourself or others.  You are taking medicine for panic attacks and  have a serious side effect. MAKE SURE YOU:  Understand these instructions.  Will watch your condition.  Will get help right away if you are not  doing well or get worse. Document Released: 03/23/2005 Document Revised: 03/28/2013 Document Reviewed: 11/04/2012 [facility] Patient Information 2015 [place], [place]. This information is not intended to replace advice given to you by your health care provider. Make sure you discuss any questions you have with your health care provider. Stress Stress-related medical problems are becoming increasingly common. The body has a built-in physical response to stressful situations. Faced with pressure, challenge or danger, we need to react quickly. Our bodies release hormones such as cortisol and adrenaline to help do this. These hormones are part of the "fight or flight" response and affect the metabolic rate, heart rate and blood pressure, resulting in a heightened, stressed state that prepares the body for optimum performance in dealing with a stressful situation. It is likely that early man required these mechanisms to stay alive, but usually modern stresses do not call for this, and the same hormones released in today's world can damage health and reduce coping ability. CAUSES  Pressure to perform at work, at school or in sports.  Threats of physical violence.  Money worries.  Arguments.  Family conflicts.  Divorce or separation from significant other.  Bereavement.  New job or unemployment.  Changes in location.  Alcohol or drug abuse. SOMETIMES, THERE IS NO PARTICULAR REASON FOR DEVELOPING STRESS. Almost all people are at risk of being stressed at some time in their lives. It is important to know that some stress is temporary and some is long term.  Temporary stress will go away when a situation is resolved. Most people can cope with short periods of stress, and it can often be relieved by relaxing, taking a walk or getting any type of exercise, chatting through issues with friends, or having a good night's sleep.  Chronic (long-term, continuous) stress is much harder to deal with. It  can be psychologically and emotionally damaging. It can be harmful both for an individual and for friends and family. SYMPTOMS Everyone reacts to stress differently. There are some common effects that help [place] recognize it. In times of extreme stress, people may:  Shake uncontrollably.  Breathe faster and deeper than normal (hyperventilate).  Vomit.  For people with asthma, stress can trigger an attack.  For some people, stress may trigger migraine headaches, ulcers, and body pain. PHYSICAL EFFECTS OF STRESS MAY INCLUDE:  Loss of energy.  Skin problems.  Aches and pains resulting from tense muscles, including neck ache, backache and tension headaches.  Increased pain from arthritis and other conditions.  Irregular heart beat (palpitations).  Periods of irritability or anger.  Apathy or depression.  Anxiety (feeling uptight or worrying).  Unusual behavior.  Loss of appetite.  Comfort eating.  Lack of concentration.  Loss of, or decreased, sex-drive.  Increased smoking, drinking, or recreational drug use.  For women, missed periods.  Ulcers, joint pain, and muscle pain. Post-traumatic stress is the stress caused by any serious accident, strong emotional damage, or extremely difficult or violent experience such as rape or war. Post-traumatic stress victims can experience mixtures of emotions such as fear, shame, depression, guilt or anger. It may include recurrent memories or images that may be haunting. These feelings can last for weeks, months or even years after the traumatic event that triggered them. Specialized treatment, possibly with medicines and psychological therapies, is available. If stress is causing physical  symptoms, severe distress or making it difficult for you to function as normal, it is worth seeing your caregiver. It is important to remember that although stress is a usual part of life, extreme or prolonged stress can lead to other illnesses that will  need treatment. It is better to visit a doctor sooner rather than later. Stress has been linked to the development of high blood pressure and heart disease, as well as insomnia and depression. There is no diagnostic test for stress since everyone reacts to it differently. But a caregiver will be able to spot the physical symptoms, such as:  Headaches.  Shingles.  Ulcers. Emotional distress such as intense worry, low mood or irritability should be detected when the doctor asks pertinent questions to identify any underlying problems that might be the cause. In case there are physical reasons for the symptoms, the doctor may also want to do some tests to exclude certain conditions. If you feel that you are suffering from stress, try to identify the aspects of your life that are causing it. Sometimes you may not be able to change or avoid them, but even a small change can have a positive ripple effect. A simple lifestyle change can make all the difference. STRATEGIES THAT CAN HELP DEAL WITH STRESS:  Delegating or sharing responsibilities.  Avoiding confrontations.  Learning to be more assertive.  Regular exercise.  Avoid using alcohol or street drugs to cope.  Eating a healthy, balanced diet, rich in fruit and vegetables and proteins.  Finding humor or absurdity in stressful situations.  Never taking on more than you know you can handle comfortably.  Organizing your time better to get as much done as possible.  Talking to friends or family and sharing your thoughts and fears.  Listening to music or relaxation tapes.  Relaxation techniques like deep breathing, meditation, and yoga.  Tensing and then relaxing your muscles, starting at the toes and working up to the head and neck. If you think that you would benefit from help, either in identifying the things that are causing your stress or in learning techniques to help you relax, see a caregiver who is capable of helping you with  this. Rather than relying on medications, it is usually better to try and identify the things in your life that are causing stress and try to deal with them. There are many techniques of managing stress including counseling, psychotherapy, aromatherapy, yoga, and exercise. Your caregiver can help you determine what is best for you. Document Released: 06/13/2002 Document Revised: 03/28/2013 Document Reviewed: 05/10/2007 [facility] Patient Information 2015 [place], [place]. This information is not intended to replace advice given to you by your health care provider. Make sure you discuss any questions you have with your health care provider.

## 2013-11-28 NOTE — ED Notes (Signed)
Pt alert and oriented; conversing with RN; ambulated with steady gait.

## 2013-11-28 NOTE — ED Provider Notes (Signed)
CSN: [id number]     Arrival date & time 11/28/13  1813 History   First MD Initiated Contact with Patient 11/28/13 2132     Chief Complaint  Patient presents with  . Altered Mental Status  . Palpitations     (Consider location/radiation/quality/duration/timing/severity/associated sxs/prior Treatment) HPI  Patient to the ER with complaints of chest pains, palpitations, difficulty speaking, jaw tightness, and panic attack prior to arrival. She has been very stressed out due being a single mother and has not had her Klonopin in quite some time. She has had history of the same in the past and was seen at the Urgent Care one year ago with the exact same presentation. She reports feeling better now that she has had some time to calm down in the ED. She did not have weakness, change of vision, loc, nausea or vomiting. She reports feeling very overwhelmed and needing help. 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 She denies SI/HI, hallucinations, delusions or substance abuse problems. She denies wanting to speak with anyone in the ER regarding her hopelessness, pressure and and anxiety.  Past Medical History  Diagnosis Date  . Lymphadenitis   . Anxiety    Past Surgical History  Procedure Laterality Date  . Cholecystectomy    . Appendectomy    . Foot surgery    . Tubal ligation    . Uterine fibroid surgery      cauterization of aneurysm   Family History  Problem Relation Age of Onset  . Cancer Father     prostate  . Heart disease Maternal Grandmother     unsure   History  Substance Use Topics  . Smoking status: Current Some Day Smoker -- 0.25 packs/day for 10 years  . Smokeless tobacco: Not on file     Comment: Patient using E cigarette  . Alcohol Use: Yes     Comment: twice per month   OB History   Grav Para Term Preterm Abortions TAB  SAB Ect Mult Living                 Review of Systems  All other systems reviewed and are negative.     Allergies  Cephalexin; Conjugated estrogens; Cyclobenzaprine hcl; Ephedrine sulfate; Rabeprazole sodium; and Sulfonamide derivatives  Home Medications   Prior to Admission medications   Medication Sig Start Date End Date Taking? Authorizing Provider  FLUoxetine (PROZAC) 20 MG capsule Take 20 mg by mouth daily.   Yes Historical Provider, MD  ibuprofen (ADVIL,MOTRIN) 200 MG tablet Take 400 mg by mouth every 6 (six) hours as needed. pain   Yes Historical Provider, MD  LORazepam (ATIVAN) 1 MG tablet Take 1 mg by mouth 3 (three) times daily.   Yes Historical Provider, MD  clonazePAM (KLONOPIN) 0.5 MG tablet Take 1 tablet (0.5 mg total) by mouth 2 (two) times daily as needed for anxiety. 11/28/13   Linus Mako, PA-C   BP 93/72  Pulse 64  Temp(Src) 98.1 F (36.7 C) (Oral)  Resp 21  Ht 5\' 1"  (1.549 m)  Wt 105 lb (47.628 kg)  BMI 19.85 kg/m2  SpO2 99%  LMP 11/14/2013 Physical Exam  Nursing note and vitals reviewed. Constitutional: She appears well-developed and well-nourished. No distress.  HENT:  Head: Normocephalic and atraumatic.  Eyes: Pupils are equal, round,  and reactive to light.  Neck: Normal range of motion. Neck supple.  Cardiovascular: Normal rate and regular rhythm.   Pulmonary/Chest: Effort normal.  Abdominal: Soft.  Neurological: She is alert.  Skin: Skin is warm and dry.    ED Course  Procedures (including critical care time) Labs Review Labs Reviewed  CBC - Abnormal; Notable for the following:    MCH 34.6 (*)    All other components within normal limits  BASIC METABOLIC PANEL  I-STAT TROPOININ, ED  I-STAT TROPOININ, ED    Imaging Review No results found.   EKG Interpretation   Date/Time:  Tuesday November 28 2013 18:22:17 EDT Ventricular Rate:  82 PR Interval:  128 QRS Duration: 76 QT Interval:  360 QTC Calculation: 420 R Axis:    86 Text Interpretation:  Normal sinus rhythm Normal ECG ED PHYSICIAN  INTERPRETATION AVAILABLE IN [facility] Confirmed by TEST, Record  ([id number]) on 11/30/2013 11:47:46 AM      Terrien, Terrance UK:[id number] 28-Nov-2013 18:22:17 [facility]/ED ROUTINE RECORD Normal sinus rhythm Normal ECG ED PHYSICIAN INTERPRETATION AVAILABLE IN [facility] Confirmed by TEST, Record ([id number]) on 11/30/2013 11:47:46 AM 32mm/s 37mm/mV 100Hz  8.0 SP2 12SL 237 CID: [id number] Referred by: Confirmed By: Record TEST Vent. rate 82 BPM PR interval 128 ms QRS duration 76 ms QT/QTc 360/420 ms P-R-T axes 79 86 70 08/23/1975 (38 yr) Female Caucasian Room:E39C Loc:11 Technician: [id number] Test BTD:VVOHYWVPX of breath    MDM   Final diagnoses:  Anxiety attack    The patient has no risk factors for cardiovascular disease. She has heart score of 0. She does have a significant amount of anxiety in her life and has not had any Klonopin for a while which was helpful. She is to see a counselor who would prescribe her medicine but after losing her medical benefits has not been able to go back to be seen.  Her EKG does not show any concerning changes. She has had 2 negative troponins in the emergency department, a normal chest x-ray and sometime to calm down. She does admit to having these symptoms when she has panic attack but reports this one is worse than recent attacks.  She was not given any medication in the emergency department and with time her vitals improved to pulse 64, respirations 21, blood pressure 93/72 and 99% oxygen on room air. She had no significant findings on laboratory workup. I highly doubt PE - no risk factors and no longer having chest pains or SOB.   Was given a small prescription for the Klonopin and given referral to behavior health. Prior to discharge the patient denies suicidal or homicidal ideation. I do not feel that she meets criteria to be IVC and she shows that the capacity to  be free to leave the hospital.  38 y.o.Nimo C Newport's evaluation in the Emergency Department is complete. It has been determined that no acute conditions requiring further emergency intervention are present at this time. The patient/guardian have been advised of the diagnosis and plan. We have discussed signs and symptoms that warrant return to the ED, such as changes or worsening in symptoms.  Vital signs are stable at discharge. Filed Vitals:   11/28/13 2330  BP: 93/72  Pulse: 64  Temp:   Resp: 21    Patient/guardian has voiced understanding and agreed to follow-up with the PCP or specialist.   Linus Mako, PA-C 12/02/13 1549

## 2013-11-28 NOTE — ED Notes (Signed)
Pt states that she used to take klonopin for anxiety but has been off of it because of insurance problems. States that she has been off of it for 3 months. States that she has been stressed lately and feels she possible had a panic attack. States that she has had to use her friends xanax for anxiety over the past three months but she does not like taking xanax because it makes her feel bad the nezt day. Also c/o insomnia, states klonopin helps her sleep.

## 2013-11-28 NOTE — ED Notes (Signed)
Pt reports that she was driving and approx 9622 had onset of palpitations, not feeling well, difficulty speaking, jaw tightness, cp. Pt thinks possible panic attacks, hx of same and has high stress levels. Pt is a&ox4 at triage and answering questions appropriately. ekg being done, HR 82.

## 2013-12-03 NOTE — ED Provider Notes (Signed)
Medical screening examination/treatment/procedure(s) were performed by non-physician practitioner and as supervising physician I was immediately available for consultation/collaboration.   EKG Interpretation   Date/Time:  Tuesday November 28 2013 18:22:17 EDT Ventricular Rate:  82 PR Interval:  128 QRS Duration: 76 QT Interval:  360 QTC Calculation: 420 R Axis:   86 Text Interpretation:  Normal sinus rhythm Normal ECG ED PHYSICIAN  INTERPRETATION AVAILABLE IN [facility] Confirmed by TEST, Record  ([id number]) on 11/30/2013 11:47:46 AM       Richarda Blade, MD 12/03/13 [phone]

## 2014-05-21 ENCOUNTER — Other Ambulatory Visit (HOSPITAL_COMMUNITY)
Admission: RE | Admit: 2014-05-21 | Discharge: 2014-05-21 | Disposition: A | Discharge status: Home / Self Care | Payer: Medicaid Other | Source: Ambulatory Visit | Attending: Obstetrics and Gynecology | Admitting: Obstetrics and Gynecology

## 2014-05-21 ENCOUNTER — Encounter: Payer: Self-pay | Admitting: Obstetrics and Gynecology

## 2014-05-21 ENCOUNTER — Ambulatory Visit (INDEPENDENT_AMBULATORY_CARE_PROVIDER_SITE_OTHER): Payer: Medicaid Other | Admitting: Obstetrics and Gynecology

## 2014-05-21 VITALS — BP 109/65 | HR 71 | Ht 61.0 in | Wt 109.8 lb

## 2014-05-21 DIAGNOSIS — Z01411 Encounter for gynecological examination (general) (routine) with abnormal findings: Secondary | ICD-10-CM | POA: Insufficient documentation

## 2014-05-21 DIAGNOSIS — Z1151 Encounter for screening for human papillomavirus (HPV): Secondary | ICD-10-CM

## 2014-05-21 DIAGNOSIS — Z01419 Encounter for gynecological examination (general) (routine) without abnormal findings: Secondary | ICD-10-CM | POA: Diagnosis not present

## 2014-05-21 DIAGNOSIS — Z0000 Encounter for general adult medical examination without abnormal findings: Secondary | ICD-10-CM

## 2014-05-21 DIAGNOSIS — Z124 Encounter for screening for malignant neoplasm of cervix: Secondary | ICD-10-CM

## 2014-05-21 NOTE — Progress Notes (Signed)
  Subjective:     Melissa Allison is a 39 y.o. female G3P3 with LMP 05/11/2014 and BMI 20 who is here for a comprehensive physical exam. The patient reports no problems. The patient is not currently sexually active but has had a tubal ligation for contraception.  History   Social History  . Marital Status: Divorced    Spouse Name: N/A  . Number of Children: 3  . Years of Education: N/A   Occupational History  . Not on file.   Social History Main Topics  . Smoking status: Current Some Day Smoker -- 0.50 packs/day for 10 years    Types: Cigarettes  . Smokeless tobacco: Never Used  . Alcohol Use: 0.0 oz/week    0 Standard drinks or equivalent per week     Comment: twice per month  . Drug Use: No  . Sexual Activity: Not Currently    Birth Control/ Protection: Surgical     Comment: tubal ligation   Other Topics Concern  . Not on file   Social History Narrative   Lives in [place] - with 3 sons.     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   Health Maintenance  Topic Date Due  . INFLUENZA VACCINE  07/05/2014 (Originally 11/04/2013)  . PAP SMEAR  08/13/2015  . TETANUS/TDAP  10/19/2021   Past Medical History  Diagnosis Date  . Lymphadenitis   . Anxiety    Past Surgical History  Procedure Laterality Date  . Cholecystectomy    . Appendectomy    . Foot surgery    . Tubal ligation    . Uterine fibroid surgery      cauterization of aneurysm   Family History  Problem Relation Age of Onset  . Cancer Father     prostate  . Heart disease Maternal Grandmother     unsure       Review of Systems A comprehensive review of systems was negative.   Objective:      GENERAL: Well-developed, well-nourished female in no acute distress.  HEENT: Normocephalic, atraumatic. Sclerae anicteric.  NECK: Supple. Normal thyroid.  LUNGS: Clear to auscultation bilaterally.  HEART: Regular rate and rhythm. BREASTS: Symmetric in size. No palpable masses or lymphadenopathy, skin changes, or  nipple drainage. ABDOMEN: Soft, nontender, nondistended. No organomegaly. PELVIC: Normal external female genitalia. Vagina is pink and rugated.  Normal discharge. Normal appearing cervix. Uterus is normal in size. No adnexal mass or tenderness. EXTREMITIES: No cyanosis, clubbing, or edema, 2+ distal pulses.    Assessment:    Healthy female exam.      Plan:    pap smear performed Patient advised to perform monthly self breast and vulva exams. Patient will be contacted with any abnormal results See After Visit Summary for Counseling Recommendations

## 2014-05-23 LAB — CYTOLOGY - PAP

## 2014-05-28 ENCOUNTER — Telehealth: Payer: Self-pay | Admitting: Obstetrics and Gynecology

## 2014-05-28 DIAGNOSIS — Z8719 Personal history of other diseases of the digestive system: Secondary | ICD-10-CM

## 2014-05-28 MED ORDER — OMEPRAZOLE 20 MG PO CPDR: 20.0000 mg | DELAYED_RELEASE_CAPSULE | Freq: Every day | ORAL | Status: DC

## 2014-05-28 NOTE — Telephone Encounter (Signed)
Patient called and requested refill on her prilosec

## 2015-07-03 ENCOUNTER — Other Ambulatory Visit: Payer: Self-pay | Admitting: Obstetrics and Gynecology

## 2015-11-04 IMAGING — CR DG CHEST 2V
2 series · 2 of 2 positions shown · non-contrast
Comparison: 01/04/2013 and 05/21/2012

CLINICAL DATA: Jaw tightness with nausea in shortness-of-breath
with palpitations. Difficulty speaking. Smoker.

EXAM:
CHEST  2 VIEW

[w chest pa]
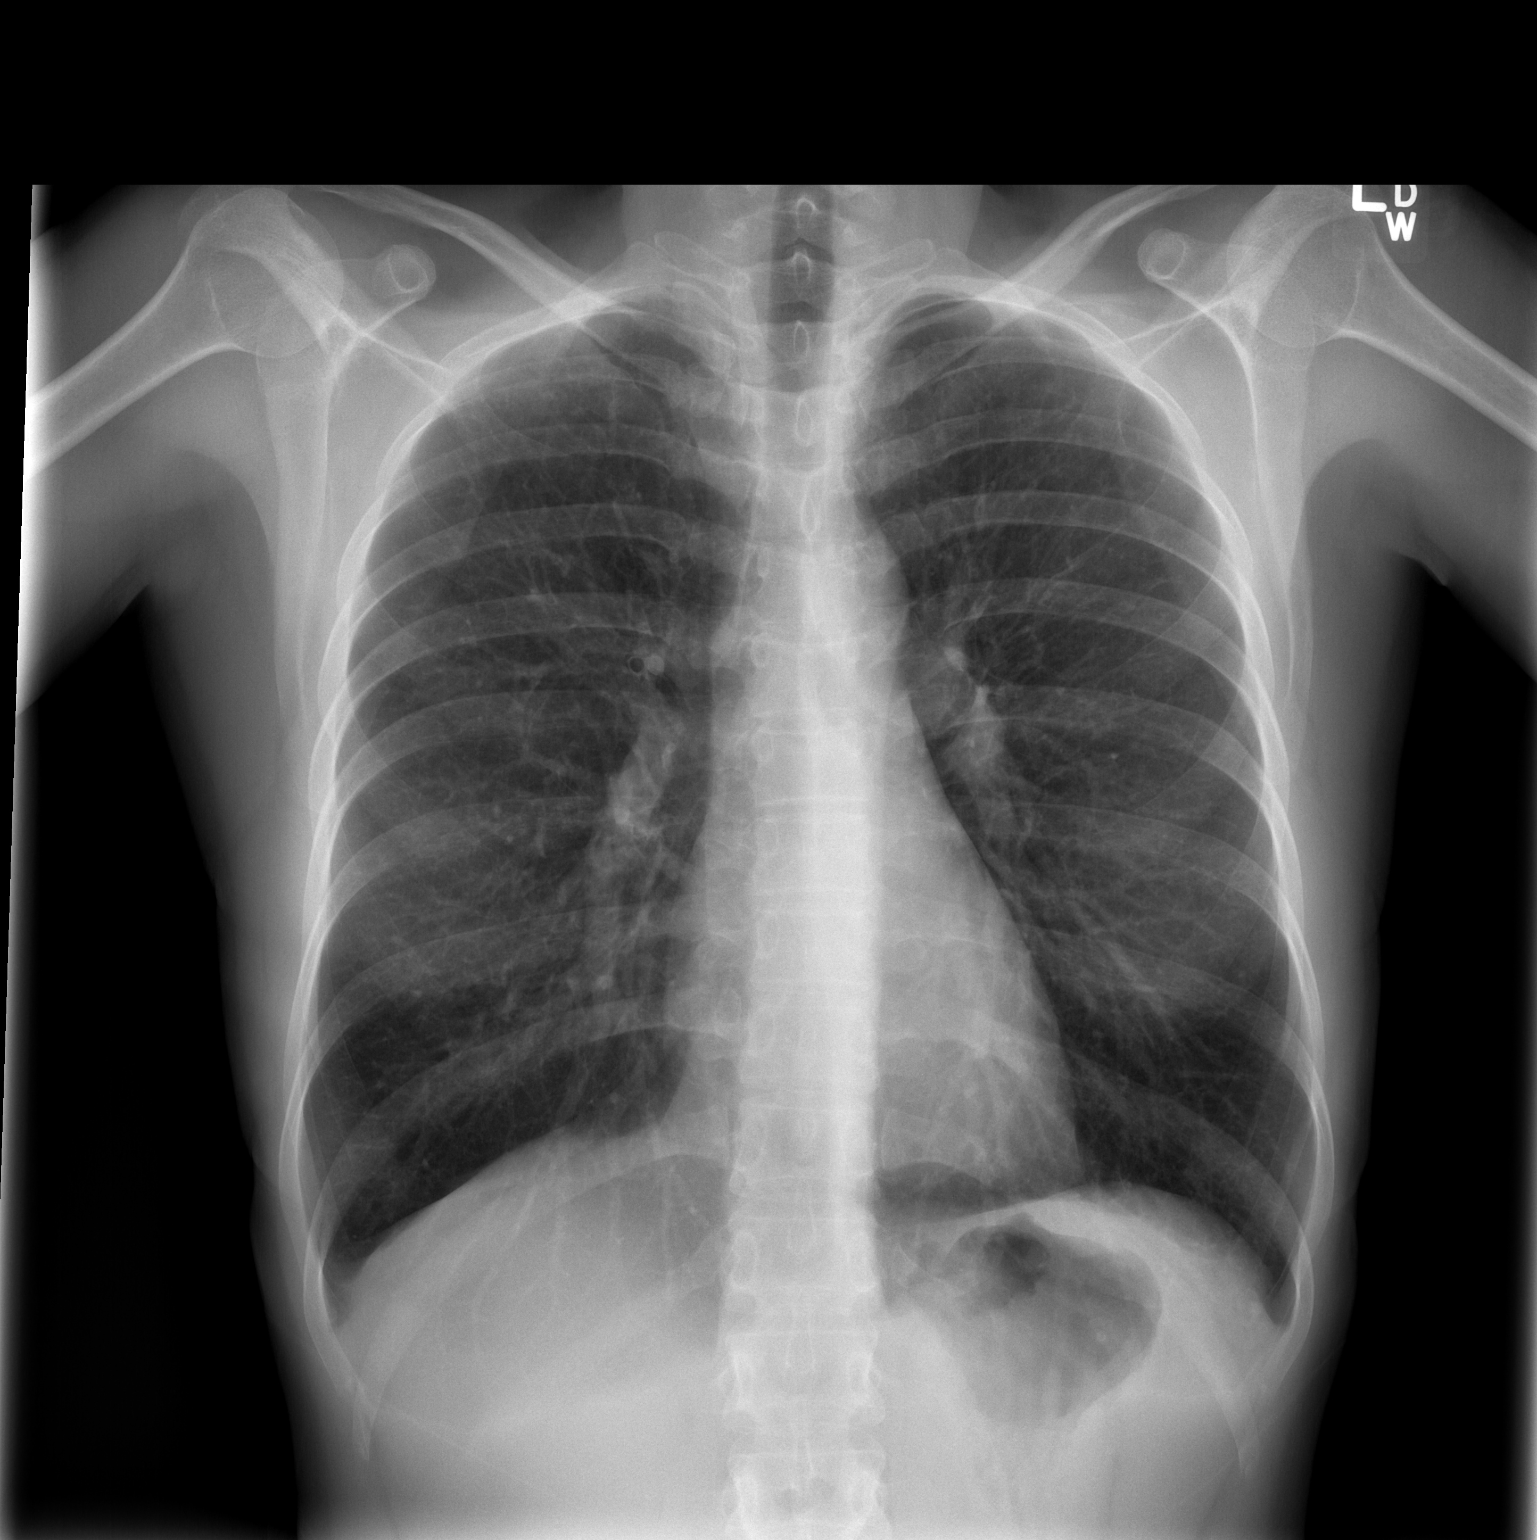

[w chest lat]
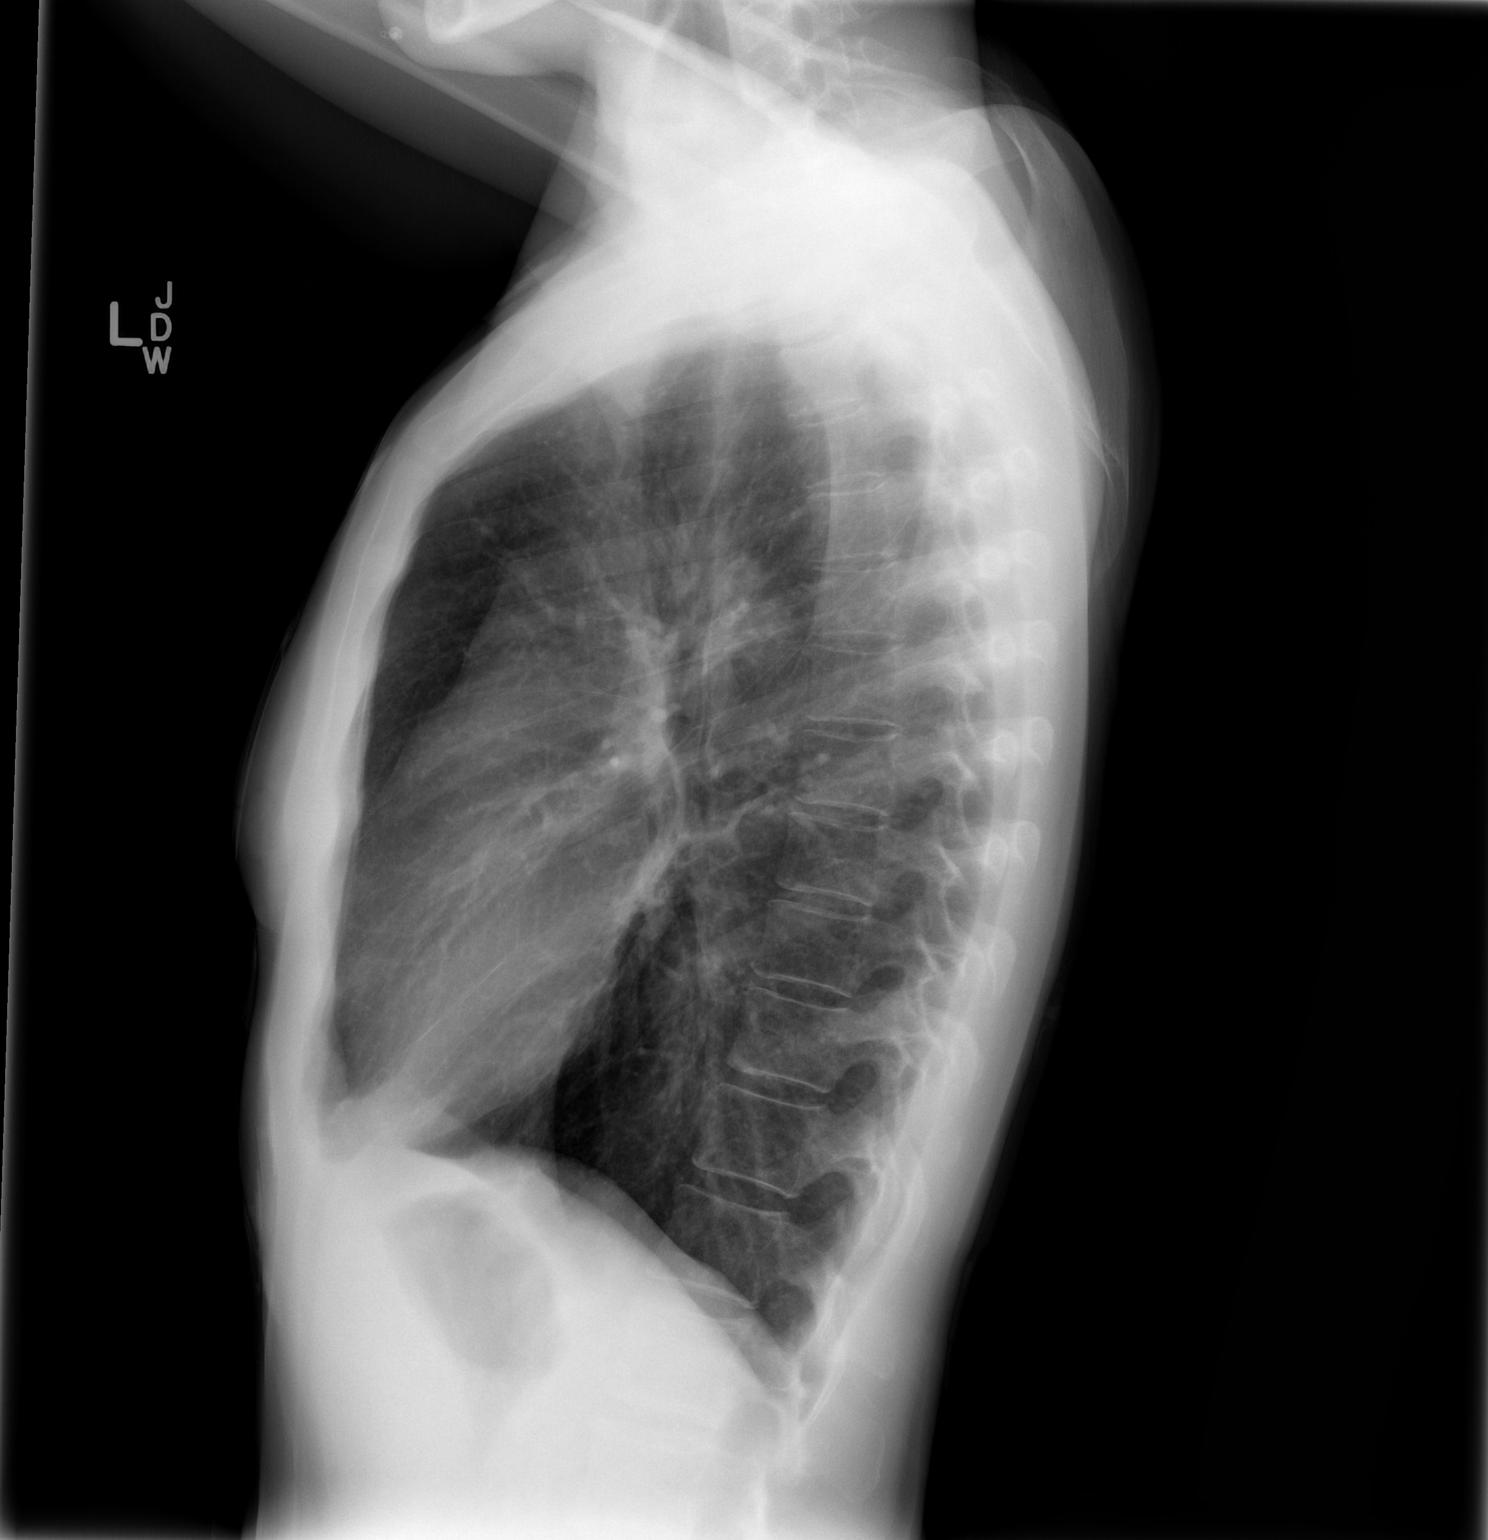

[2 of 2 positions shown; findings below may reference images not displayed]

FINDINGS: Lungs are clear. Cardiomediastinal silhouette and remainder of the
exam is unchanged.
IMPRESSION: No active cardiopulmonary disease.

## 2016-10-11 ENCOUNTER — Emergency Department (HOSPITAL_COMMUNITY)
Admission: EM | Admit: 2016-10-11 | Discharge: 2016-10-11 | Disposition: A | Discharge status: Home / Self Care | Payer: Medicaid Other | Attending: Emergency Medicine | Admitting: Emergency Medicine

## 2016-10-11 ENCOUNTER — Encounter (HOSPITAL_COMMUNITY): Payer: Self-pay

## 2016-10-11 DIAGNOSIS — F13930 Sedative, hypnotic or anxiolytic use, unspecified with withdrawal, uncomplicated: Secondary | ICD-10-CM

## 2016-10-11 DIAGNOSIS — F13230 Sedative, hypnotic or anxiolytic dependence with withdrawal, uncomplicated: Secondary | ICD-10-CM

## 2016-10-11 DIAGNOSIS — F17210 Nicotine dependence, cigarettes, uncomplicated: Secondary | ICD-10-CM | POA: Insufficient documentation

## 2016-10-11 MED ORDER — TRAZODONE HCL 50 MG PO TABS: 50.0000 mg | ORAL_TABLET | Freq: Every evening | ORAL | 0 refills | Status: DC | PRN

## 2016-10-11 MED ORDER — TRAZODONE HCL 50 MG PO TABS: 50.0000 mg | ORAL_TABLET | Freq: Every evening | ORAL | Status: DC | PRN

## 2016-10-11 MED ORDER — ZOLPIDEM TARTRATE 5 MG PO TABS: 5.0000 mg | ORAL_TABLET | Freq: Every evening | ORAL | 0 refills | Status: DC | PRN

## 2016-10-11 MED ORDER — HYDROXYZINE HCL 25 MG PO TABS: 25.0000 mg | ORAL_TABLET | Freq: Four times a day (QID) | ORAL | 0 refills | Status: AC | PRN

## 2016-10-11 MED ORDER — PROPRANOLOL HCL 20 MG PO TABS
20.0000 mg | ORAL_TABLET | Freq: Once | ORAL | Status: AC
  Administered 2016-10-11: 20 mg via ORAL
  Filled 2016-10-11: qty 1

## 2016-10-11 MED ORDER — HYDROXYZINE HCL 25 MG PO TABS
25.0000 mg | ORAL_TABLET | Freq: Once | ORAL | Status: AC
  Administered 2016-10-11: 25 mg via ORAL
  Filled 2016-10-11: qty 1

## 2016-10-11 NOTE — BH Assessment (Signed)
[place] Assessment Progress Note TTS order to be d/c'd-pt given resources, discussed options for treatment/detox but no TTS eval needed per Dr. Wilson Singer. Pt denies SI, HI, AVH. Pt states that the MD who prescribed her the benzos has lost his license, not longer practicing, so she cannot contact them for consult.

## 2016-10-11 NOTE — ED Provider Notes (Signed)
[facility] DEPT Provider Note   CSN: [id number] Arrival date & time: 10/11/16  1104  By signing my name below, I, Sonum Patel, attest that this documentation has been prepared under the direction and in the presence of Virgel Manifold, MD. Electronically Signed: Sonum Patel, Education administrator. 10/11/16. 12:42 PM.  History   Chief Complaint Chief Complaint  Patient presents with  . Withdrawal    The history is provided by the patient. No language interpreter was used.     HPI Comments: Melissa Allison is a 41 y.o. female who presents to the Emergency Department complaining of withdrawal symptoms for the past week after stopping Klonopin. She reports taking 0.5 mg nightly for the past several years. She states she ran out at some point and realized how dependent she was on the medication. She last had this medication 1 week ago and since then has experienced insomnia, palpitations, generalized body aches, and generalized tremors. She does not like how this medication has affected her and decided to stop taking it. She tried an OTC supplement to help her symptoms without success. She has also tried home remedies such as lavender and hot baths without relief. She states she does not feel safe to be home alone as she experiences these symptoms. 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 She denies auditory or visual hallucinations. She occasionally smokes marijuana but states she cannot do this nor smoke cigarettes.    Past Medical History:  Diagnosis Date  . Anxiety   . Lymphadenitis     Patient Active Problem List   Diagnosis Date Noted  . Aortic dissection, abdominal ([place]) 05/16/2012  . Heart palpitations 04/11/2012  . Well woman exam with routine gynecological exam 10/20/2011  . At risk for hepatitis 10/20/2011  . Exposure to sexually transmitted disease (STD) 10/20/2011  . Arm numbness left 10/06/2011  . LSIL (low grade squamous  intraepithelial lesion) on Pap smear 09/09/2011  . Mole of skin 09/09/2011  . Mood disorder ([place]) 09/09/2011  . GAD (generalized anxiety disorder) 08/04/2007  . [place] OTH 06/23/2007  . HEADACHE 02/23/2007  . TOBACCO ABUSE 09/10/2006  . GERD 09/10/2006  . ECZEMA 09/10/2006    Past Surgical History:  Procedure Laterality Date  . APPENDECTOMY    . CHOLECYSTECTOMY    . FOOT SURGERY    . TUBAL LIGATION    . UTERINE FIBROID SURGERY     cauterization of aneurysm    OB History    No data available       Home Medications    Prior to Admission medications   Medication Sig Start Date End Date Taking? Authorizing Provider  clonazePAM (KLONOPIN) 0.5 MG tablet Take 1 tablet (0.5 mg total) by mouth 2 (two) times daily as needed for anxiety. 11/28/13   Delos Haring, PA-C  ibuprofen (ADVIL,MOTRIN) 200 MG tablet Take 400 mg by mouth every 6 (six) hours as needed. pain    [provider]  omeprazole (PRILOSEC) 20 MG capsule TAKE 1 CAPSULE EVERY DAY 07/04/15   Constant, Peggy, MD    Family History Family History  Problem Relation Age of Onset  . Cancer Father        prostate  . Heart disease Maternal Grandmother        unsure    Social History Social History  Substance Use Topics  . Smoking status: Current Some Day Smoker    Packs/day:  0.50    Years: 10.00    Types: Cigarettes  . Smokeless tobacco: Never Used  . Alcohol use 0.0 oz/week     Comment: twice per month     Allergies   Sulfa antibiotics; Cephalexin; Conjugated estrogens; Cyclobenzaprine hcl; Ephedrine sulfate; Rabeprazole sodium; and Sulfonamide derivatives   Review of Systems Review of Systems  All other systems reviewed and are negative for acute change except as noted in the HPI.   Physical Exam Updated Vital Signs BP 124/81 (BP Location: Left Arm)   Pulse 72   Temp 98.8 F (37.1 C) (Oral)   Resp 16   Ht 5\' 2"  (2.952 m)   Wt 105 lb (47.6 kg)   LMP  09/20/2016   SpO2 100%   BMI 19.20 kg/m   Physical Exam  Constitutional: She is oriented to person, place, and time. She appears well-developed and well-nourished.  Crying at time, anxious  HENT:  Head: Normocephalic and atraumatic.  Eyes: EOM are normal.  Neck: Normal range of motion.  Cardiovascular: Normal rate, regular rhythm and normal heart sounds.   Pulmonary/Chest: Effort normal and breath sounds normal.  Abdominal: Soft. She exhibits no distension. There is no tenderness.  Musculoskeletal: Normal range of motion.  Neurological: She is alert and oriented to person, place, and time.  Tremulous   Skin: Skin is warm and dry.  Psychiatric: Judgment normal. Her mood appears anxious.  Nursing note and vitals reviewed.    ED Treatments / Results  DIAGNOSTIC STUDIES: Oxygen Saturation is 100% on RA, normal by my interpretation.    COORDINATION OF CARE: 12:25 PM Discussed treatment plan with pt at bedside and pt agreed to plan.   Labs (all labs ordered are listed, but only abnormal results are displayed) Labs Reviewed - No data to display  EKG  EKG Interpretation None       Radiology No results found.  Procedures Procedures (including critical care time)  Medications Ordered in ED Medications - No data to display   Initial Impression / Assessment and Plan / ED Course  I have reviewed the triage vital signs and the nursing notes.  Pertinent labs & imaging results that were available during my care of the patient were reviewed by me and considered in my medical decision making (see chart for details).     40yF with symptoms of benzo withdrawal. I think best option with her symptoms and desire to return to work as soon as possible would be a long taper over months supervised in outpt setting. She is not interested in continuing benzos at all though. We can try to treat her symptoms otherwise but likely won't be as effective and probably keep her out of work  longer particularly since she is a English as a second language teacher. No SI or HI. It has been over a week since she last had any no no seizure/hallucinations. She is anxious/tremulous but no tachycardia/hypertension and mentation is normal aside from losing train of thought frequently.   Symptomatc treatment otherwise. Outpatient resources.  Final Clinical Impressions(s) / ED Diagnoses   Final diagnoses:  Benzodiazepine withdrawal without complication (HCC)    New Prescriptions New Prescriptions   No medications on file    I personally preformed the services scribed in my presence. The recorded information has been reviewed is accurate. Virgel Manifold, MD.    Virgel Manifold, MD 10/14/16 [phone]

## 2016-10-11 NOTE — ED Triage Notes (Signed)
Pt wanting to stop klonopin.  Pt has been off for a week.  Pt not sleeping and c/o generalized pain.

## 2016-10-12 ENCOUNTER — Emergency Department (HOSPITAL_COMMUNITY)
Admission: EM | Admit: 2016-10-12 | Discharge: 2016-10-13 | Disposition: A | Discharge status: Home / Self Care | Payer: Self-pay | Attending: Emergency Medicine | Admitting: Emergency Medicine

## 2016-10-12 ENCOUNTER — Encounter (HOSPITAL_COMMUNITY): Payer: Self-pay

## 2016-10-12 DIAGNOSIS — F13930 Sedative, hypnotic or anxiolytic use, unspecified with withdrawal, uncomplicated: Secondary | ICD-10-CM

## 2016-10-12 DIAGNOSIS — F13230 Sedative, hypnotic or anxiolytic dependence with withdrawal, uncomplicated: Secondary | ICD-10-CM | POA: Insufficient documentation

## 2016-10-12 DIAGNOSIS — F410 Panic disorder [episodic paroxysmal anxiety] without agoraphobia: Secondary | ICD-10-CM | POA: Insufficient documentation

## 2016-10-12 DIAGNOSIS — F17210 Nicotine dependence, cigarettes, uncomplicated: Secondary | ICD-10-CM | POA: Insufficient documentation

## 2016-10-12 DIAGNOSIS — F12280 Cannabis dependence with cannabis-induced anxiety disorder: Secondary | ICD-10-CM

## 2016-10-12 DIAGNOSIS — F411 Generalized anxiety disorder: Secondary | ICD-10-CM

## 2016-10-12 DIAGNOSIS — G4700 Insomnia, unspecified: Secondary | ICD-10-CM

## 2016-10-12 LAB — COMPREHENSIVE METABOLIC PANEL
ALBUMIN: 4.4 g/dL (ref 3.5–5.0)
ALK PHOS: 45 U/L (ref 38–126)
ALT: 11 U/L — ABNORMAL LOW (ref 14–54)
AST: 19 U/L (ref 15–41)
Anion gap: 9 (ref 5–15)
BILIRUBIN TOTAL: 1.1 mg/dL (ref 0.3–1.2)
BUN: 12 mg/dL (ref 6–20)
CO2: 21 mmol/L — AB (ref 22–32)
Calcium: 8.7 mg/dL — ABNORMAL LOW (ref 8.9–10.3)
Chloride: 108 mmol/L (ref 101–111)
Creatinine, Ser: 0.65 mg/dL (ref 0.44–1.00)
GFR calc Af Amer: >60 mL/min (ref >60)
GFR calc non Af Amer: >60 mL/min (ref >60)
GLUCOSE: 80 mg/dL (ref 65–99)
POTASSIUM: 3.4 mmol/L — AB (ref 3.5–5.1)
SODIUM: 138 mmol/L (ref 135–145)
TOTAL PROTEIN: 7 g/dL (ref 6.5–8.1)

## 2016-10-12 LAB — CBC WITH DIFFERENTIAL/PLATELET
BASOS ABS: 0 10*3/uL (ref 0.0–0.1)
BASOS PCT: 0 %
EOS ABS: 0.1 10*3/uL (ref 0.0–0.7)
EOS PCT: 1 %
HCT: 37.9 % (ref 36.0–46.0)
Hemoglobin: 13.5 g/dL (ref 12.0–15.0)
LYMPHS PCT: 22 %
Lymphs Abs: 2.4 10*3/uL (ref 0.7–4.0)
MCH: 33.6 pg (ref 26.0–34.0)
MCHC: 35.6 g/dL (ref 30.0–36.0)
MCV: 94.3 fL (ref 78.0–100.0)
MONO ABS: 0.6 10*3/uL (ref 0.1–1.0)
Monocytes Relative: 6 %
Neutro Abs: 7.5 10*3/uL (ref 1.7–7.7)
Neutrophils Relative %: 71 %
PLATELETS: 224 10*3/uL (ref 150–400)
RBC: 4.02 MIL/uL (ref 3.87–5.11)
RDW: 12.7 % (ref 11.5–15.5)
WBC: 10.7 10*3/uL — AB (ref 4.0–10.5)

## 2016-10-12 LAB — URINALYSIS, ROUTINE W REFLEX MICROSCOPIC
Bilirubin Urine: NEGATIVE
GLUCOSE, UA: NEGATIVE mg/dL
HGB URINE DIPSTICK: NEGATIVE
Ketones, ur: 20 mg/dL — AB
NITRITE: NEGATIVE
PROTEIN: NEGATIVE mg/dL
SPECIFIC GRAVITY, URINE: 1.023 (ref 1.005–1.030)
pH: 5 (ref 5.0–8.0)

## 2016-10-12 LAB — RAPID URINE DRUG SCREEN, HOSP PERFORMED
Amphetamines: NOT DETECTED
BARBITURATES: NOT DETECTED
Benzodiazepines: NOT DETECTED
Cocaine: NOT DETECTED
Opiates: NOT DETECTED
TETRAHYDROCANNABINOL: POSITIVE — AB

## 2016-10-12 LAB — ETHANOL: Alcohol, Ethyl (B): <5 mg/dL (ref <5)

## 2016-10-12 LAB — I-STAT BETA HCG BLOOD, ED (MC, WL, AP ONLY)

## 2016-10-12 MED ORDER — IBUPROFEN 200 MG PO TABS
600.0000 mg | ORAL_TABLET | Freq: Three times a day (TID) | ORAL | Status: DC | PRN
  Administered 2016-10-13: 600 mg via ORAL
  Filled 2016-10-12: qty 3

## 2016-10-12 MED ORDER — NICOTINE 21 MG/24HR TD PT24: 21.0000 mg | MEDICATED_PATCH | Freq: Every day | TRANSDERMAL | Status: DC

## 2016-10-12 MED ORDER — SODIUM CHLORIDE 0.9 % IV BOLUS (SEPSIS)
1000.0000 mL | Freq: Once | INTRAVENOUS | Status: AC
  Administered 2016-10-12: 1000 mL via INTRAVENOUS

## 2016-10-12 MED ORDER — ZOLPIDEM TARTRATE 5 MG PO TABS: 5.0000 mg | ORAL_TABLET | Freq: Every evening | ORAL | Status: DC | PRN

## 2016-10-12 MED ORDER — ALUM & MAG HYDROXIDE-SIMETH 200-200-20 MG/5ML PO SUSP: 30.0000 mL | Freq: Four times a day (QID) | ORAL | Status: DC | PRN

## 2016-10-12 MED ORDER — ONDANSETRON HCL 4 MG/2ML IJ SOLN
4.0000 mg | Freq: Once | INTRAMUSCULAR | Status: AC
  Administered 2016-10-12: 4 mg via INTRAVENOUS
  Filled 2016-10-12: qty 2

## 2016-10-12 MED ORDER — ONDANSETRON HCL 4 MG PO TABS: 4.0000 mg | ORAL_TABLET | Freq: Three times a day (TID) | ORAL | Status: DC | PRN

## 2016-10-12 NOTE — ED Triage Notes (Signed)
Pt seen yesterday for same.  Pt coming off klonopin.  Told yesterday, if not better to return. Pt states she is not sleeping.

## 2016-10-12 NOTE — ED Provider Notes (Signed)
[facility] DEPT Provider Note   CSN: [id number] Arrival date & time: 10/12/16  1538     History   Chief Complaint Chief Complaint  Patient presents with  . Withdrawal    HPI Melissa Allison is a 41 y.o. female.  Pt presents to the ED today with klonopin withdrawal sx.  The pt has been on klonopin 0.5 mg nightly for about 10 years.  She ran out about 1 week ago.  She said she has been having palpitations, insomnia, generalized tremors.  Pt said she has not been able to sleep since she stopped.  She was here last night for the same and thought she could do it by herself, but she said she paced all night long last night and feels like she is having a panic attack.  She feels like she is going to die.  She does not feel safe and is worried that she will hurt herself in order to end her agony.  She absolutely does not want any more benzos or any other addictive meds.       Past Medical History:  Diagnosis Date  . Anxiety   . Lymphadenitis     Patient Active Problem List   Diagnosis Date Noted  . Aortic dissection, abdominal ([place]) 05/16/2012  . Heart palpitations 04/11/2012  . Well woman exam with routine gynecological exam 10/20/2011  . At risk for hepatitis 10/20/2011  . Exposure to sexually transmitted disease (STD) 10/20/2011  . Arm numbness left 10/06/2011  . LSIL (low grade squamous intraepithelial lesion) on Pap smear 09/09/2011  . Mole of skin 09/09/2011  . Mood disorder ([place]) 09/09/2011  . GAD (generalized anxiety disorder) 08/04/2007  . [place] OTH 06/23/2007  . HEADACHE 02/23/2007  . TOBACCO ABUSE 09/10/2006  . GERD 09/10/2006  . ECZEMA 09/10/2006    Past Surgical History:  Procedure Laterality Date  . APPENDECTOMY    . CHOLECYSTECTOMY    . FOOT SURGERY    . TUBAL LIGATION    . UTERINE FIBROID SURGERY     cauterization of aneurysm    OB History    No data available       Home Medications    Prior to  Admission medications   Medication Sig Start Date End Date Taking? Authorizing Provider  cholecalciferol (VITAMIN D) 1000 units tablet Take 1,000 Units by mouth daily.   Yes [provider]  hydrOXYzine (ATARAX/VISTARIL) 25 MG tablet Take 1 tablet (25 mg total) by mouth every 6 (six) hours as needed for anxiety. 10/11/16  Yes Virgel Manifold, MD  ibuprofen (ADVIL,MOTRIN) 200 MG tablet Take 400 mg by mouth every 6 (six) hours as needed. pain   Yes [provider]  Multiple Vitamin (MULTIVITAMIN WITH MINERALS) TABS tablet Take 1 tablet by mouth daily.   Yes [provider]  Omega-3 Fatty Acids (FISH OIL) 1000 MG CAPS Take 1 capsule by mouth daily.   Yes [provider]  S-Adenosylmethionine-B6-B12-FA (MOOD PLUS STRESS RELIEF PO) Take 1 tablet by mouth daily as needed (lower stress).   Yes [provider]  traZODone (DESYREL) 50 MG tablet Take 1 tablet (50 mg total) by mouth at bedtime as needed for sleep. 10/11/16  Yes Virgel Manifold, MD  clonazePAM (KLONOPIN) 0.5 MG tablet Take 1 tablet (0.5 mg total) by mouth 2 (two) times daily as needed for anxiety. Patient not taking: Reported on 10/12/2016 11/28/13   Delos Haring, PA-C  omeprazole (PRILOSEC) 20 MG capsule TAKE  [place] Patient not taking: Reported on 10/11/2016 07/04/15   Constant, Peggy, MD  zolpidem (AMBIEN) 5 MG tablet Take 1 tablet (5 mg total) by mouth at bedtime as needed for sleep. 10/11/16   Virgel Manifold, MD    Family History Family History  Problem Relation Age of Onset  . Cancer Father        prostate  . Heart disease Maternal Grandmother        unsure    Social History Social History  Substance Use Topics  . Smoking status: Current Some Day Smoker    Packs/day: 0.50    Years: 10.00    Types: Cigarettes  . Smokeless tobacco: Never Used  . Alcohol use 0.0 oz/week     Comment: twice per month     Allergies   Sulfa antibiotics; Cephalexin; Conjugated estrogens;  Cyclobenzaprine hcl; Ephedrine sulfate; Rabeprazole sodium; and Sulfonamide derivatives   Review of Systems Review of Systems  Gastrointestinal: Positive for nausea.  Psychiatric/Behavioral: Positive for decreased concentration and sleep disturbance. The patient is nervous/anxious.   All other systems reviewed and are negative.    Physical Exam Updated Vital Signs BP 112/74   Pulse 67   Temp 98.7 F (37.1 C) (Oral)   Resp 16   Ht 5\' 2"  (1.575 m)   Wt 40.8 kg (90 lb)   LMP 09/20/2016   SpO2 97%   BMI 16.46 kg/m   Physical Exam  Constitutional: She is oriented to person, place, and time. She appears well-developed. She appears distressed.  HENT:  Head: Normocephalic and atraumatic.  Right Ear: External ear normal.  Left Ear: External ear normal.  Nose: Nose normal.  Mouth/Throat: Oropharynx is clear and moist.  Eyes: Conjunctivae and EOM are normal. Pupils are equal, round, and reactive to light.  Neck: Normal range of motion. Neck supple.  Cardiovascular: Normal rate, regular rhythm, normal heart sounds and intact distal pulses.   Pulmonary/Chest: Effort normal and breath sounds normal.  Abdominal: Soft. Bowel sounds are normal.  Musculoskeletal: Normal range of motion.  Neurological: She is alert and oriented to person, place, and time.  Skin: Skin is warm.  Psychiatric: Her mood appears anxious. She exhibits a depressed mood. She expresses suicidal ideation.  Nursing note and vitals reviewed.    ED Treatments / Results  Labs (all labs ordered are listed, but only abnormal results are displayed) Labs Reviewed  COMPREHENSIVE METABOLIC PANEL - Abnormal; Notable for the following:       Result Value   Potassium 3.4 (*)    CO2 21 (*)    Calcium 8.7 (*)    ALT 11 (*)    All other components within normal limits  CBC WITH DIFFERENTIAL/PLATELET - Abnormal; Notable for the following:    WBC 10.7 (*)    All other components within normal limits  ETHANOL  RAPID  URINE DRUG SCREEN, HOSP PERFORMED  URINALYSIS, ROUTINE W REFLEX MICROSCOPIC  I-STAT BETA HCG BLOOD, ED ([facility], [facility], [facility] ONLY)    EKG  EKG Interpretation None       Radiology No results found.  Procedures Procedures (including critical care time)  Medications Ordered in ED Medications  ibuprofen (ADVIL,MOTRIN) tablet 600 mg (not administered)  zolpidem (AMBIEN) tablet 5 mg (not administered)  ondansetron (ZOFRAN) tablet 4 mg (not administered)  alum & mag hydroxide-simeth (MAALOX/MYLANTA) 200-200-20 MG/5ML suspension 30 mL (not administered)  nicotine (NICODERM CQ - dosed in mg/24 hours) patch 21 mg (not administered)  sodium chloride 0.9 %  bolus 1,000 mL (1,000 mLs Intravenous New Bag/Given 10/12/16 2232)  ondansetron [facility]) injection 4 mg (4 mg Intravenous Given 10/12/16 2232)     Initial Impression / Assessment and Plan / ED Course  I have reviewed the triage vital signs and the nursing notes.  Pertinent labs & imaging results that were available during my care of the patient were reviewed by me and considered in my medical decision making (see chart for details).    TTS consult ordered.  The pt is requesting mental health help.  Disposition pending consult.  Final Clinical Impressions(s) / ED Diagnoses   Final diagnoses:  Benzodiazepine withdrawal without complication ([place])  Insomnia, unspecified type  Panic attacks    New Prescriptions New Prescriptions   No medications on file     Isla Pence, MD 10/12/16 2318

## 2016-10-13 DIAGNOSIS — F411 Generalized anxiety disorder: Secondary | ICD-10-CM

## 2016-10-13 DIAGNOSIS — F12280 Cannabis dependence with cannabis-induced anxiety disorder: Secondary | ICD-10-CM

## 2016-10-13 DIAGNOSIS — F17210 Nicotine dependence, cigarettes, uncomplicated: Secondary | ICD-10-CM

## 2016-10-13 MED ORDER — HYDROXYZINE HCL 25 MG PO TABS
25.0000 mg | ORAL_TABLET | Freq: Once | ORAL | Status: AC
  Administered 2016-10-13: 25 mg via ORAL
  Filled 2016-10-13: qty 1

## 2016-10-13 MED ORDER — HYDROXYZINE HCL 25 MG PO TABS: 25.0000 mg | ORAL_TABLET | Freq: Three times a day (TID) | ORAL | Status: DC | PRN

## 2016-10-13 NOTE — ED Notes (Signed)
Pt called financial services, then had this Probation officer speak with Immunologist (on the phone) about having Registration at [facility] come to speak with pt about billing. This Probation officer called Registration, representative from registration brought information for pt on financial services (including numbers and addresses). This Probation officer provided patient with information and patient stated "I didn't ask for this! I want someone to help me with the orange card and apply for Medicaid." This writer notified RN of this situation. RN provided patient with information on applying for Medicaid.

## 2016-10-13 NOTE — ED Notes (Signed)
Patient arrived to unit and is cooperative with care at this time. Patient states she has been unable to sleep for several days due to medication withdrawal. Patient verbally contracts for safety and denies plans to harm herself. Patient given tomato soup, crackers and water upon request. Patient also given a warm blanket. Pt asking for medications to help her sleep, but she is currently drowsy and falling asleep between sentences. No distress noted, respirations regular and unlabored.

## 2016-10-13 NOTE — Discharge Instructions (Signed)
To help you maintain a sober lifestyle, a substance abuse treatment program may be beneficial to you.  Contact one of the following facilities at your earliest opportunity to ask about enrolling:  RESIDENTIAL PROGRAMS:       [facility]      [place], [place] [address]      [phone]       [facility]      [address] [place], [place] [address]      [phone]       [facility]      [place], [place] [address]      [phone]  OUTPATIENT PROGRAMS:       [facility]      [address], [place]. [place], [place] [address]      [phone]      New patients are seen at the walk-in clinic every Tuesday from 9:00 am - 12:00 pm.

## 2016-10-13 NOTE — ED Notes (Signed)
Pt inquired about the [facility] Financial services number. This Probation officer provided patient with [facility] number, [facility] Billing Department number and [facility] number [phone]). Pt had concerns about paying for her visit.

## 2016-10-13 NOTE — Consult Note (Addendum)
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 Aortic dissection, abdominal ([place]) [I71.02] 05/16/2012  . Heart palpitations [R00.2] 04/11/2012  . Well woman exam with routine gynecological exam [Y78.295] 10/20/2011  . At risk for hepatitis [Z91.89] 10/20/2011  . Exposure to sexually transmitted disease (STD) [Z20.2] 10/20/2011  . Arm numbness left [R20.0] 10/06/2011  . LSIL (low grade squamous intraepithelial lesion) on Pap smear [IMO0002] 09/09/2011  . Mole of skin [D22.9] 09/09/2011  . Mood disorder ([place]) [F39] 09/09/2011  . GAD (generalized anxiety disorder) [F41.1] 08/04/2007  . OTH SPEC PERS HX PRESENTING HAZARDS HEALTH OTH [Z91.89] 06/23/2007  . HEADACHE [R51] 02/23/2007  . TOBACCO ABUSE [F17.200] 09/10/2006  . GERD [K21.9] 09/10/2006  . ECZEMA [L25.9] 09/10/2006    Total Time spent with patient: 30 minutes  Subjective:   Melissa Allison is a 41 y.o. female patient admitted with reports of transient suicidal ideation but later reports she was upset at the time and has been denying today. Pt seen and chart reviewed. Pt is alert/oriented x4, calm, cooperative, and appropriate to situation. Pt denies suicidal/homicidal ideation and psychosis and does not appear to be responding to internal stimuli. Pt is demanding inpatient treatment for benzo withdrawal but states she does not want benzos or other meds. Pt insists she is in benzo withdrawal yet reports last dose of benzo 10 days ago  was 0.51m clonazepam and was only taking that daily.  HPI:  I have reviewed and concur with HPI elements below, modified as follows:  "Melissa Allison an 41y.o.divorced female, who voluntarily came into [facility], with her friend.  Patient reported coming into ED, due to feelings of suicidal ideation, with an unspecified plan to use weapons that she may have access to, such as guns and knives.  Patient stated that he suicidal ideations and plan occurred after she discontinued her u[place]on Klonopin, on 10/06/2016, which she stated she took for the previous 10 years to assist with her anxiety.  Patient stated that she decided to stop taking Klonopin on her own, without consulting with her provider.   Patient reported currently consuming approximately 1 gram of cannabis on a weekly basis. Patient expressed having depressive symptoms, such as fatigue, insomnia, isolation, tearfulness, and anger. Patient denies, HI/AVH. Per medical records (10/11/2016, HGilford Raid MD):  Patient was given resources, discussed options for treatment/detox, prior to discharge.  Patient denied SI, HI, and AVH. Patient stated that the MD who prescribed her the benzos has lost his license, no longer practicing, so she cannot contact them for consult.  Patient stated that she is currently residing with her boyfriend and son, however often disagrees wither boyfriend.  Patient identified her mother and sister as supports for her, but stated that she did not want to be like them, due to their substance use.  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  Patient stated that she is currently not receiving services from any outpatient providers.    During assessment, Patient was alert and cooperative.  Patient  was sitting up in her bed and dressed in scrubs.  Patient was oriented to the time, person, situation, and location.  Patient's eye contact was fair.  Patient's speech was soft and slow.  Patient thought process appeared to be coherent, relevant, and circumstantial.  Patient's mood appeared to be depressed, in despair, and sad.  Patient's judgement appeared to be unimpaired.  Patient reported wanting to be able to stop taking Klonopin, without having to take additional medications.  Patient made statements, such as "I don't feel safe by myself," "Sometimes, I get so mad," I'm not doing good with people," and "I feel people are out to get me."  Pt has been cooperative in ED but stating she is in withdrawal but does not want benzos. Pt seen today on 10/13/16 as above.   Past Psychiatric History: benzo usage, GAD  Risk to Self: No Specify Access to Suicidal Means: denies How many times?: 0 Other Self Harm Risks: Patient denies Triggers for Past Attempts: None known Intentional Self Injurious Behavior: None Risk to Others: Homicidal Ideation: No (Patient denies) Thoughts of Harm to Others: Not Currently Comment - Thoughts of Harm to Others: denies Current Homicidal Intent: No (Patient denies) Current Homicidal Plan: No (Patient denies) Access to Homicidal Means: No (Patient denies) Identified Victim: Patient denies History of harm to others?: No Assessment of Violence: On admission Violent Behavior Description: Patient denies Does patient have access to weapons?:denies Criminal Charges Pending?: No (Patient denies) Does patient have a court date: No (Patient denies) Prior Inpatient Therapy: Prior Inpatient Therapy: No Prior Therapy Dates: None Prior Therapy Facilty/Provider(s): None Reason for Treatment: None Prior Outpatient Therapy: Prior Outpatient Therapy: No Prior Therapy Dates: None Prior Therapy Facilty/Provider(s): None Reason for Treatment: None Does patient have an ACCT team?:  No Does patient have Intensive In-House Services?  : No Does patient have [facility] services? : No Does patient have P4CC services?: No  Past Medical History:  Past Medical History:  Diagnosis Date  . Anxiety   . Lymphadenitis     Past Surgical History:  Procedure Laterality Date  . APPENDECTOMY    . CHOLECYSTECTOMY    . FOOT SURGERY    . TUBAL LIGATION    . UTERINE FIBROID SURGERY     cauterization of aneurysm   Family History:  Family History  Problem Relation Age of Onset  . Cancer Father        prostate  . Heart disease Maternal Grandmother        unsure   Family Psychiatric  History: depression Social History:  History  Alcohol Use  . 0.0 oz/week    Comment: twice per month     History  Drug Use  . Types: Marijuana    Social History   Social History  . Marital status: Divorced    Spouse name: N/A  . Number of children: 3  . Years of education: N/A   Social History Main Topics  . Smoking status: Current Some Day Smoker    Packs/day: 0.50    Years: 10.00    Types: Cigarettes  . Smokeless tobacco: Never Used  . Alcohol use 0.0 oz/week     Comment: twice per month  . Drug use: Yes    Types: Marijuana  . Sexual activity:  Not Currently    Birth control/ protection: Surgical     Comment: tubal ligation   Other Topics Concern  . None   Social History Narrative   Lives in [place] - with 3 sons.     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   Additional Social History:    Allergies:   Allergies  Allergen Reactions  . Sulfa Antibiotics Rash  . Cephalexin     REACTION: tongue swelling  . Conjugated Estrogens     REACTION: unspecified  . Cyclobenzaprine Hcl     REACTION: unspecified  . Ephedrine Sulfate   . Rabeprazole Sodium     REACTION: unspecified  . Sulfonamide Derivatives     REACTION: unspecified    Labs:  Results for orders placed or performed during the hospital encounter of 10/12/16 (from the past 48 hour(s))  Comprehensive metabolic  panel     Status: Abnormal   Collection Time: 10/12/16  9:50 PM  Result Value Ref Range   Sodium 138 135 - 145 mmol/L   Potassium 3.4 (L) 3.5 - 5.1 mmol/L   Chloride 108 101 - 111 mmol/L   CO2 21 (L) 22 - 32 mmol/L   Glucose, Bld 80 65 - 99 mg/dL   BUN 12 6 - 20 mg/dL   Creatinine, Ser 4.14 0.44 - 1.00 mg/dL   Calcium 8.7 (L) 8.9 - 10.3 mg/dL   Total Protein 7.0 6.5 - 8.1 g/dL   Albumin 4.4 3.5 - 5.0 g/dL   AST 19 15 - 41 U/L   ALT 11 (L) 14 - 54 U/L   Alkaline Phosphatase 45 38 - 126 U/L   Total Bilirubin 1.1 0.3 - 1.2 mg/dL   GFR calc non Af Amer >60 >60 mL/min   GFR calc Af Amer >60 >60 mL/min    Comment: (NOTE) The eGFR has been calculated using the CKD EPI equation. This calculation has not been validated in all clinical situations. eGFR's persistently <60 mL/min signify possible Chronic Kidney Disease.    Anion gap 9 5 - 15  CBC with Diff     Status: Abnormal   Collection Time: 10/12/16  9:50 PM  Result Value Ref Range   WBC 10.7 (H) 4.0 - 10.5 K/uL   RBC 4.02 3.87 - 5.11 MIL/uL   Hemoglobin 13.5 12.0 - 15.0 g/dL   HCT 43.6 01.6 - 58.0 %   MCV 94.3 78.0 - 100.0 fL   MCH 33.6 26.0 - 34.0 pg   MCHC 35.6 30.0 - 36.0 g/dL   RDW 06.3 49.4 - 94.4 %   Platelets 224 150 - 400 K/uL   Neutrophils Relative % 71 %   Neutro Abs 7.5 1.7 - 7.7 K/uL   Lymphocytes Relative 22 %   Lymphs Abs 2.4 0.7 - 4.0 K/uL   Monocytes Relative 6 %   Monocytes Absolute 0.6 0.1 - 1.0 K/uL   Eosinophils Relative 1 %   Eosinophils Absolute 0.1 0.0 - 0.7 K/uL   Basophils Relative 0 %   Basophils Absolute 0.0 0.0 - 0.1 K/uL  Ethanol     Status: None   Collection Time: 10/12/16 10:37 PM  Result Value Ref Range   Alcohol, Ethyl (B) <5 <5 mg/dL    Comment:        LOWEST DETECTABLE LIMIT FOR SERUM ALCOHOL IS 5 mg/dL FOR MEDICAL PURPOSES ONLY   I-Stat beta hCG blood, ED     Status: None   Collection Time: 10/12/16 10:42 PM  Result Value Ref Range  I-stat hCG, quantitative <5.0 <5 mIU/mL    Comment 3            Comment:   GEST. AGE      CONC.  (mIU/mL)   <=1 WEEK        5 - 50     2 WEEKS       50 - 500     3 WEEKS       100 - 10,000     4 WEEKS     1,000 - 30,000        FEMALE AND NON-PREGNANT FEMALE:     LESS THAN 5 mIU/mL   Urine rapid drug screen (hosp performed)     Status: Abnormal   Collection Time: 10/12/16 11:07 PM  Result Value Ref Range   Opiates NONE DETECTED NONE DETECTED   Cocaine NONE DETECTED NONE DETECTED   Benzodiazepines NONE DETECTED NONE DETECTED   Amphetamines NONE DETECTED NONE DETECTED   Tetrahydrocannabinol POSITIVE (A) NONE DETECTED   Barbiturates NONE DETECTED NONE DETECTED    Comment:        DRUG SCREEN FOR MEDICAL PURPOSES ONLY.  IF CONFIRMATION IS NEEDED FOR ANY PURPOSE, NOTIFY LAB WITHIN 5 DAYS.        LOWEST DETECTABLE LIMITS FOR URINE DRUG SCREEN Drug Class       Cutoff (ng/mL) Amphetamine      1000 Barbiturate      200 Benzodiazepine   622 Tricyclics       633 Opiates          300 Cocaine          300 THC              50   Urinalysis, Routine w reflex microscopic     Status: Abnormal   Collection Time: 10/12/16 11:07 PM  Result Value Ref Range   Color, Urine YELLOW YELLOW   APPearance CLOUDY (A) CLEAR   Specific Gravity, Urine 1.023 1.005 - 1.030   pH 5.0 5.0 - 8.0   Glucose, UA NEGATIVE NEGATIVE mg/dL   Hgb urine dipstick NEGATIVE NEGATIVE   Bilirubin Urine NEGATIVE NEGATIVE   Ketones, ur 20 (A) NEGATIVE mg/dL   Protein, ur NEGATIVE NEGATIVE mg/dL   Nitrite NEGATIVE NEGATIVE   Leukocytes, UA MODERATE (A) NEGATIVE   RBC / HPF 0-5 0 - 5 RBC/hpf   WBC, UA 6-30 0 - 5 WBC/hpf   Bacteria, UA RARE (A) NONE SEEN   Squamous Epithelial / LPF 6-30 (A) NONE SEEN   Mucous PRESENT     Current Facility-Administered Medications  Medication Dose Route Frequency Provider Last Rate Last Dose  . alum & mag hydroxide-simeth (MAALOX/MYLANTA) 200-200-20 MG/5ML suspension 30 mL  30 mL Oral Q6H PRN Isla Pence, MD      .  hydrOXYzine (ATARAX/VISTARIL) tablet 25 mg  25 mg Oral TID PRN Darleene Cleaver, Ecko Beasley, MD      . ibuprofen (ADVIL,MOTRIN) tablet 600 mg  600 mg Oral Q8H PRN Isla Pence, MD   600 mg at 10/13/16 0558  . nicotine (NICODERM CQ - dosed in mg/24 hours) patch 21 mg  21 mg Transdermal Daily Isla Pence, MD      . ondansetron [facility]) tablet 4 mg  4 mg Oral Q8H PRN Isla Pence, MD      . zolpidem (AMBIEN) tablet 5 mg  5 mg Oral QHS PRN Isla Pence, MD       Current Outpatient Prescriptions  Medication Sig Dispense Refill  .  cholecalciferol (VITAMIN D) 1000 units tablet Take 1,000 Units by mouth daily.    . hydrOXYzine (ATARAX/VISTARIL) 25 MG tablet Take 1 tablet (25 mg total) by mouth every 6 (six) hours as needed for anxiety. 20 tablet 0  . ibuprofen (ADVIL,MOTRIN) 200 MG tablet Take 400 mg by mouth every 6 (six) hours as needed. pain    . Multiple Vitamin (MULTIVITAMIN WITH MINERALS) TABS tablet Take 1 tablet by mouth daily.    . Omega-3 Fatty Acids (FISH OIL) 1000 MG CAPS Take 1 capsule by mouth daily.    . S-Adenosylmethionine-B6-B12-FA (MOOD PLUS STRESS RELIEF PO) Take 1 tablet by mouth daily as needed (lower stress).    . traZODone (DESYREL) 50 MG tablet Take 1 tablet (50 mg total) by mouth at bedtime as needed for sleep. 10 tablet 0  . clonazePAM (KLONOPIN) 0.5 MG tablet Take 1 tablet (0.5 mg total) by mouth 2 (two) times daily as needed for anxiety. (Patient not taking: Reported on 10/12/2016) 20 tablet 0  . omeprazole (PRILOSEC) 20 MG capsule TAKE 1 CAPSULE EVERY DAY (Patient not taking: Reported on 10/11/2016) 30 capsule 7  . zolpidem (AMBIEN) 5 MG tablet Take 1 tablet (5 mg total) by mouth at bedtime as needed for sleep. 7 tablet 0    Musculoskeletal: Strength & Muscle Tone: within normal limits Gait & Station: normal Patient leans: N/A  Psychiatric Specialty Exam: Physical Exam  Review of Systems  Psychiatric/Behavioral: Positive for depression and substance abuse ([facility]).  Negative for hallucinations and suicidal ideas. The patient is nervous/anxious.   All other systems reviewed and are negative.   Blood pressure 113/65, pulse 82, temperature 98.4 F (36.9 C), temperature source Oral, resp. rate 18, height _0  (1.575 m), weight 40.8 kg (90 lb), last menstrual period 09/20/2016, SpO2 100 %.Body mass index is 16.46 kg/m.  General Appearance: Casual and Fairly Groomed  Eye Contact:  Fair  Speech:  Clear and Coherent and Normal Rate  Volume:  Normal  Mood:  Anxious  Affect:  Appropriate and Congruent  Thought Process:  Coherent, Goal Directed, Linear and Descriptions of Associations: Intact  Orientation:  Full (Time, Place, and Person)  Thought Content:  Focused on stating she is withdrawing from benzos yet last dose 10 days ago, pt argumentative stating we are wrong  Suicidal Thoughts:  No  Homicidal Thoughts:  No  Memory:  Immediate;   Fair Recent;   Fair Remote;   Fair  Judgement:  Fair  Insight:  Fair  Psychomotor Activity:  Normal  Concentration:  Concentration: Fair and Attention Span: Fair  Recall:  [employer] of Knowledge:  Fair  Language:  Fair  Akathisia:  No  Handed:    AIMS (if indicated):     Assets:  Communication Skills Desire for Improvement Resilience Social Support  ADL's:  Intact  Cognition:  WNL  Sleep:      Treatment Plan Summary: 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   Patient does not meet criteria for psychiatric inpatient admission. Supportive therapy provided about ongoing stressors. Refer to IOP. Discussed crisis plan, support from social network, calling 911, coming to the Emergency Department, and calling Suicide Hotline. Pt to followup at [facility], [place] 10/13/2016 1:41 PM  Patient seen face-to-face for psychiatric evaluation, chart  reviewed and case discussed with the physician extender and developed treatment plan. Reviewed  the information documented and agree with the treatment plan. Corena Pilgrim, MD

## 2016-10-13 NOTE — [facility] Suicide Risk Assessment (Signed)
  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 Aortic dissection, abdominal ([place]) [I71.02] 05/16/2012  . Heart palpitations [R00.2] 04/11/2012  . Well woman exam with routine gynecological exam [T88.828] 10/20/2011  . At risk for hepatitis [Z91.89] 10/20/2011  . Exposure to sexually transmitted disease (STD) [Z20.2] 10/20/2011  . Arm numbness left [R20.0] 10/06/2011  . LSIL (low grade squamous intraepithelial lesion) on Pap smear [IMO0002] 09/09/2011  . Mole of skin [D22.9] 09/09/2011  . Mood disorder ([place]) [F39] 09/09/2011  . GAD (generalized anxiety disorder) [F41.1] 08/04/2007  . OTH SPEC PERS HX PRESENTING HAZARDS HEALTH OTH [Z91.89] 06/23/2007  . HEADACHE [R51] 02/23/2007  . TOBACCO ABUSE [F17.200] 09/10/2006  . GERD [K21.9] 09/10/2006  . ECZEMA [L25.9] 09/10/2006    Total Time spent with patient: 30 minutes  Musculoskeletal: Strength & Muscle Tone: within normal limits Gait & Station: normal Patient leans: N/A  Psychiatric Specialty Exam:   Blood pressure 113/65, pulse 82, temperature 98.4 F (36.9 C), temperature source Oral, resp. rate 18, height 5\' 2"  (1.575 m), weight 40.8 kg (90 lb), last menstrual period 09/20/2016, SpO2 100 %.Body mass index is 16.46 kg/m.   General Appearance: Casual and Fairly Groomed  Eye Contact:  Fair  Speech:  Clear and Coherent and Normal Rate  Volume:  Normal  Mood:  Anxious  Affect:  Appropriate and Congruent  Thought Process:  Coherent, Goal Directed, Linear and Descriptions of Associations: Intact  Orientation:  Full (Time, Place, and Person)  Thought Content:  Focused on stating she is withdrawing from benzos yet last dose 10 days ago, pt  argumentative stating we are wrong  Suicidal Thoughts:  No  Homicidal Thoughts:  No  Memory:  Immediate;   Fair Recent;   Fair Remote;   Fair  Judgement:  Fair  Insight:  Fair  Psychomotor Activity:  Normal  Concentration:  Concentration: Fair and Attention Span: Fair  Recall:  [employer] of Knowledge:  Fair  Language:  Fair  Akathisia:  No  Handed:    AIMS (if indicated):     Assets:  Communication Skills Desire for Improvement Resilience Social Support  ADL's:  Intact  Cognition:  WNL  Sleep:        Mental Status Per Nursing Assessment::   On Admission:     Demographic Factors:  Low socioeconomic status and Unemployed  Loss Factors: Financial problems/change in socioeconomic status  Historical Factors: Impulsivity  Risk Reduction Factors:   Positive therapeutic relationship and Positive coping skills or problem solving skills  Continued Clinical Symptoms:  Previous Psychiatric Diagnoses and Treatments  Cognitive Features That Contribute To Risk:  Polarized thinking    Suicide Risk:  Minimal: No identifiable suicidal ideation.  Patients presenting with no risk factors but with morbid ruminations; may be classified as minimal risk based on the severity of the depressive symptoms    Plan Of Care/Follow-up recommendations:  Activity:  As tolerated Diet:  Heart healthy with low sodium. Other:  Followup at [facility] or [facility] outpatient  Benjamine Mola, FNP 10/13/2016, 1:59 PM

## 2016-10-13 NOTE — ED Notes (Signed)
Pt discharged home. Discharged instruction given to pt who refused to have them read to her. 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 Last use of benzo was 2 weeks ago.  All belongings returned to pt who signed for same. Denies SI/HI, is not delusional and not responding to internal stimuli. Escorted pt to the ED exit.

## 2016-10-13 NOTE — ED Notes (Signed)
Bed: [facility] Expected date:  Expected time:  Means of arrival:  Comments: Hold for room 2

## 2016-10-13 NOTE — BH Assessment (Signed)
[place] Assessment Progress Note  Per Corena Pilgrim, MD, this pt does not require psychiatric hospitalization at this time.  Pt is to be discharged from [facility] with substance abuse referral information.  This has been included in pt's discharge instructions.  Pt's nurse, Diane, has been notified.  Jalene Mullet, [place] Triage Specialist [phone]
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
## 2016-10-17 ENCOUNTER — Emergency Department (HOSPITAL_COMMUNITY)
Admission: EM | Admit: 2016-10-17 | Discharge: 2016-10-18 | Disposition: A | Discharge status: Other Acute Inpatient Hospital | Payer: Self-pay | Attending: Emergency Medicine | Admitting: Emergency Medicine

## 2016-10-17 ENCOUNTER — Encounter (HOSPITAL_COMMUNITY): Payer: Self-pay | Admitting: Emergency Medicine

## 2016-10-17 DIAGNOSIS — F419 Anxiety disorder, unspecified: Secondary | ICD-10-CM | POA: Insufficient documentation

## 2016-10-17 DIAGNOSIS — G4700 Insomnia, unspecified: Secondary | ICD-10-CM | POA: Insufficient documentation

## 2016-10-17 DIAGNOSIS — F17210 Nicotine dependence, cigarettes, uncomplicated: Secondary | ICD-10-CM | POA: Insufficient documentation

## 2016-10-17 DIAGNOSIS — R45851 Suicidal ideations: Secondary | ICD-10-CM | POA: Insufficient documentation

## 2016-10-17 DIAGNOSIS — Z79899 Other long term (current) drug therapy: Secondary | ICD-10-CM | POA: Insufficient documentation

## 2016-10-17 LAB — BASIC METABOLIC PANEL
ANION GAP: 8 (ref 5–15)
BUN: 9 mg/dL (ref 6–20)
CALCIUM: 8.8 mg/dL — AB (ref 8.9–10.3)
CO2: 22 mmol/L (ref 22–32)
CREATININE: 0.75 mg/dL (ref 0.44–1.00)
Chloride: 104 mmol/L (ref 101–111)
Glucose, Bld: 107 mg/dL — ABNORMAL HIGH (ref 65–99)
Potassium: 3.1 mmol/L — ABNORMAL LOW (ref 3.5–5.1)
SODIUM: 134 mmol/L — AB (ref 135–145)

## 2016-10-17 LAB — CBC
HCT: 36.2 % (ref 36.0–46.0)
HEMOGLOBIN: 12.5 g/dL (ref 12.0–15.0)
MCH: 33.3 pg (ref 26.0–34.0)
MCHC: 34.5 g/dL (ref 30.0–36.0)
MCV: 96.5 fL (ref 78.0–100.0)
PLATELETS: 242 10*3/uL (ref 150–400)
RBC: 3.75 MIL/uL — AB (ref 3.87–5.11)
RDW: 13.2 % (ref 11.5–15.5)
WBC: 9.8 10*3/uL (ref 4.0–10.5)

## 2016-10-17 LAB — RAPID URINE DRUG SCREEN, HOSP PERFORMED
Amphetamines: NOT DETECTED
BARBITURATES: NOT DETECTED
Benzodiazepines: NOT DETECTED
COCAINE: NOT DETECTED
Opiates: NOT DETECTED
TETRAHYDROCANNABINOL: POSITIVE — AB

## 2016-10-17 LAB — I-STAT TROPONIN, ED
TROPONIN I, POC: 0 ng/mL (ref 0.00–0.08)
TROPONIN I, POC: 0 ng/mL (ref 0.00–0.08)

## 2016-10-17 LAB — ETHANOL

## 2016-10-17 LAB — CBG MONITORING, ED: Glucose-Capillary: 90 mg/dL (ref 65–99)

## 2016-10-17 LAB — I-STAT BETA HCG BLOOD, ED (MC, WL, AP ONLY)

## 2016-10-17 LAB — ACETAMINOPHEN LEVEL

## 2016-10-17 LAB — SALICYLATE LEVEL: Salicylate Lvl: <7 mg/dL (ref 2.8–30.0)

## 2016-10-17 MED ORDER — TRAZODONE HCL 50 MG PO TABS: 50.0000 mg | ORAL_TABLET | Freq: Every day | ORAL | Status: DC

## 2016-10-17 MED ORDER — OMEGA-3-ACID ETHYL ESTERS 1 G PO CAPS
1.0000 g | ORAL_CAPSULE | Freq: Every day | ORAL | Status: DC
  Administered 2016-10-17 – 2016-10-18 (×2): 1 g via ORAL
  Filled 2016-10-17 (×2): qty 1

## 2016-10-17 MED ORDER — DIPHENHYDRAMINE HCL 25 MG PO CAPS
25.0000 mg | ORAL_CAPSULE | Freq: Once | ORAL | Status: AC
  Administered 2016-10-17: 25 mg via ORAL
  Filled 2016-10-17: qty 1

## 2016-10-17 MED ORDER — IBUPROFEN 200 MG PO TABS
600.0000 mg | ORAL_TABLET | Freq: Once | ORAL | Status: AC
  Administered 2016-10-17: 600 mg via ORAL
  Filled 2016-10-17: qty 1

## 2016-10-17 MED ORDER — IBUPROFEN 400 MG PO TABS
400.0000 mg | ORAL_TABLET | Freq: Four times a day (QID) | ORAL | Status: DC | PRN
  Filled 2016-10-17 (×2): qty 1

## 2016-10-17 MED ORDER — VITAMIN D 1000 UNITS PO TABS
1000.0000 [IU] | ORAL_TABLET | Freq: Every day | ORAL | Status: DC
  Administered 2016-10-17 – 2016-10-18 (×2): 1000 [IU] via ORAL
  Filled 2016-10-17 (×2): qty 1

## 2016-10-17 MED ORDER — POTASSIUM CHLORIDE CRYS ER 20 MEQ PO TBCR
40.0000 meq | EXTENDED_RELEASE_TABLET | Freq: Once | ORAL | Status: AC
  Administered 2016-10-17: 40 meq via ORAL
  Filled 2016-10-17: qty 2

## 2016-10-17 MED ORDER — HYDROXYZINE HCL 25 MG PO TABS
25.0000 mg | ORAL_TABLET | Freq: Three times a day (TID) | ORAL | Status: DC | PRN
  Administered 2016-10-18: 25 mg via ORAL
  Filled 2016-10-17: qty 1

## 2016-10-17 MED ORDER — LOPERAMIDE HCL 2 MG PO CAPS
2.0000 mg | ORAL_CAPSULE | Freq: Once | ORAL | Status: AC
  Administered 2016-10-17: 2 mg via ORAL
  Filled 2016-10-17: qty 1

## 2016-10-17 MED ORDER — SODIUM CHLORIDE 0.9 % IV BOLUS (SEPSIS)
1000.0000 mL | Freq: Once | INTRAVENOUS | Status: AC
  Administered 2016-10-17: 1000 mL via INTRAVENOUS

## 2016-10-17 NOTE — ED Notes (Signed)
Staffing Office aware of need for sitter.

## 2016-10-17 NOTE — ED Notes (Signed)
Pt asked if she could please call her 18-y-o son and let him know a "safe place" he can stay this evening. Advised pt yes and this is last phone call for the day. Voiced understanding. Pt on phone at nurses' desk.

## 2016-10-17 NOTE — ED Provider Notes (Signed)
[facility] DEPT Provider Note   CSN: [id number] Arrival date & time: 10/17/16  0032     History   Chief Complaint Chief Complaint  Patient presents with  . Chest Pain  . medication change    HPI Melissa Allison is a 41 y.o. female.  The history is provided by the patient and medical records.  Chest Pain   Associated symptoms include weakness. Pertinent negatives include no abdominal pain, no dizziness, no fever, no headaches, no nausea and no vomiting.   Melissa Allison is a 41 y.o. female  with a PMH of GAD who presents to the Emergency Department complaining of her whole body feeling "off". She states that she has been taking Klonopin 0.5 mg every night for approximately 10 years. 2 weeks ago, she decided to quit taking them. She did not titrate medication, but abruptly quit taking them. She was seen initially in [place], [place]. Unable to access records from this visit, but per patient, they did not do anything to help her. She was discharged home. She was seen at the [facility] ER on 7/08 and 7/09 for same. She states that she has been unable to sleep at all since d/c'ing klonopin. She was given an rx for vistaril on 7/09 but states that this has not been helping. She has been having diarrhea - non-bloody. This has been persistent and had loose stool this am. She also endorses associated dry mouth and feeling "drained" / very fatigued. Per chart review, patient complaining of chest pain and shortness of breath. On my evaluation, she did not mention this. When asked directed about chest pain, she states "it's everything. My whole body hurts." When asked later if she was having chest pain, she again stated that everything hurt and it was not just her chest. Denies shortness of breath. No medications other than vistaril taken prior to arrival for symptoms. She notes that she was given resource guide for inpatient substance abuse programs, however did not call any of these places. She is requesting to  "be placed somewhere to withdrawal off this medicine". She would like [place] to call and set this up. Mother at bedside feels as if she is "mentally exhausted" and is "having a nervous breakdown". Mother is worried that coming off of Klonopin has led to a significant stressor in her life and that she is going to have a breakdown if this continues. Denies SI/HI, auditory/visual hallucinations.   Past Medical History:  Diagnosis Date  . Anxiety   . Lymphadenitis     Patient Active Problem List   Diagnosis Date Noted  . 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 Aortic dissection, abdominal ([place]) 05/16/2012  . Heart palpitations 04/11/2012  . Well woman exam with routine gynecological exam 10/20/2011  . At risk for hepatitis 10/20/2011  . Exposure to sexually transmitted disease (STD) 10/20/2011  . Arm numbness left 10/06/2011  . LSIL (low grade squamous intraepithelial lesion) on Pap smear 09/09/2011  . Mole of skin 09/09/2011  . Mood disorder ([place]) 09/09/2011  . GAD (generalized anxiety disorder) 08/04/2007  . [place] OTH 06/23/2007  . HEADACHE 02/23/2007  . TOBACCO ABUSE 09/10/2006  . GERD 09/10/2006  . ECZEMA 09/10/2006    Past Surgical History:  Procedure Laterality Date  . APPENDECTOMY    . CHOLECYSTECTOMY    . FOOT SURGERY    . TUBAL LIGATION    . UTERINE FIBROID SURGERY  cauterization of aneurysm    OB History    No data available       Home Medications    Prior to Admission medications   Medication Sig Start Date End Date Taking? Authorizing Provider  acetaminophen (TYLENOL) 325 MG tablet Take 650 mg by mouth every 6 (six) hours as needed for mild pain.   Yes [provider]  aspirin EC 325 MG tablet Take 325 mg by mouth every 6 (six) hours as needed for mild pain.   Yes [provider]  cholecalciferol (VITAMIN D) 1000 units tablet Take 1,000 Units by mouth  daily.   Yes [provider]  clonazePAM (KLONOPIN) 0.5 MG tablet Take 1 tablet (0.5 mg total) by mouth 2 (two) times daily as needed for anxiety. 11/28/13  Yes Carlota Raspberry, Tiffany, PA-C  hydrOXYzine (ATARAX/VISTARIL) 25 MG tablet Take 1 tablet (25 mg total) by mouth every 6 (six) hours as needed for anxiety. 10/11/16  Yes Virgel Manifold, MD  ibuprofen (ADVIL,MOTRIN) 200 MG tablet Take 400 mg by mouth every 6 (six) hours as needed. pain   Yes [provider]  Omega-3 Fatty Acids (FISH OIL) 1000 MG CAPS Take 1 capsule by mouth daily.   Yes [provider]  S-Adenosylmethionine-B6-B12-FA (MOOD PLUS STRESS RELIEF PO) Take 1 tablet by mouth daily as needed (lower stress).   Yes [provider]  omeprazole (PRILOSEC) 20 MG capsule TAKE 1 CAPSULE EVERY DAY Patient not taking: Reported on 10/11/2016 07/04/15   Constant, Peggy, MD  traZODone (DESYREL) 50 MG tablet Take 1 tablet (50 mg total) by mouth at bedtime as needed for sleep. Patient not taking: Reported on 10/17/2016 10/11/16   Virgel Manifold, MD  zolpidem (AMBIEN) 5 MG tablet Take 1 tablet (5 mg total) by mouth at bedtime as needed for sleep. Patient not taking: Reported on 10/17/2016 10/11/16   Virgel Manifold, MD    Family History Family History  Problem Relation Age of Onset  . Cancer Father        prostate  . Heart disease Maternal Grandmother        unsure    Social History Social History  Substance Use Topics  . Smoking status: Current Some Day Smoker    Packs/day: 0.50    Years: 10.00    Types: Cigarettes  . Smokeless tobacco: Never Used  . Alcohol use 0.0 oz/week     Comment: twice per month     Allergies   Sulfa antibiotics; Cephalexin; Conjugated estrogens; Cyclobenzaprine hcl; Ephedrine sulfate; Rabeprazole sodium; and Sulfonamide derivatives   Review of Systems Review of Systems  Constitutional: Positive for fatigue. Negative for fever.  Gastrointestinal: Positive for diarrhea. Negative for  abdominal pain, blood in stool, constipation, nausea and vomiting.  Musculoskeletal: Positive for myalgias (Generalized).  Neurological: Positive for weakness. Negative for dizziness and headaches.  All other systems reviewed and are negative.    Physical Exam Updated Vital Signs BP 109/72   Pulse 72   Temp 98.1 F (36.7 C) (Oral)   Resp 12   LMP 10/16/2016   SpO2 99%   Physical Exam   ED Treatments / Results  Labs (all labs ordered are listed, but only abnormal results are displayed) Labs Reviewed  BASIC METABOLIC PANEL - Abnormal; Notable for the following:       Result Value   Sodium 134 (*)    Potassium 3.1 (*)    Glucose, Bld 107 (*)    Calcium 8.8 (*)    All other  components within normal limits  CBC - Abnormal; Notable for the following:    RBC 3.75 (*)    All other components within normal limits  I-STAT TROPOININ, ED  CBG MONITORING, ED  I-STAT TROPOININ, ED    EKG  EKG Interpretation  Date/Time:  Saturday October 17 2016 00:38:16 EDT Ventricular Rate:  71 PR Interval:  148 QRS Duration: 76 QT Interval:  400 QTC Calculation: 434 R Axis:   81 Text Interpretation:  Normal sinus rhythm Possible Left atrial enlargement Borderline ECG artifact, I, II, otherwise similar to 2015 Confirmed by Sherwood Gambler [phone]) on 10/17/2016 6:11:04 AM Also confirmed by Sherwood Gambler [phone]), editor Laurena Spies [phone])  on 10/17/2016 9:00:54 AM       Radiology No results found.  Procedures Procedures (including critical care time)  Medications Ordered in ED Medications  sodium chloride 0.9 % bolus 1,000 mL (0 mLs Intravenous Stopped 10/17/16 0812)  potassium chloride SA (K-DUR,KLOR-CON) CR tablet 40 mEq (40 mEq Oral Given 10/17/16 0825)  loperamide (IMODIUM) capsule 2 mg (2 mg Oral Given 10/17/16 5993)     Initial Impression / Assessment and Plan / ED Course  I have reviewed the triage vital signs and the nursing notes.  Pertinent labs & imaging results  that were available during my care of the patient were reviewed by me and considered in my medical decision making (see chart for details).    Melissa Allison is a 41 y.o. female who presents to ED for inability to sleep x 2 weeks, diarrhea and increased anxiety after she decided to quit taking Klonopin. She has been seen at [facility] ER twice in the last week for same and chart reviewed from these encounters. She presents back today because she still cannot sleep and she is requesting inpatient facility. Labs reviewed and reassuring. Mild hypokalemia likely 2/2 diarrhea. K+ replenished. Patient had complained about chest pain in triage, therefore trop and EKG obtained. EKG unchanged from previous and trop negative x 2. Upon my evaluation, she did not complain of chest pain or shortness of breath. Medically cleared. TTS evaluated patient and do not believe that she meets inpatient criteria. Both TTS and myself strongly encouraged patient to follow up with [facility] for outpatient psych as she has never seen therapist/counselor/psychiatrist in the past. Patient was very upset with TTS recommendations for discharge. I spoke with patient and mother at length about importance for PCP follow up and reasons to return to ER. She has rx for trazedone and vistaril from previous visits. No further rx warranted at this time. All questions were answered.   Patient discussed with Dr. Tamera Punt who agrees with treatment plan.   11:14 AM - Addendum. Patient requested to speak with me one more time at discharge. Patient began yelling, stating "If you make me leave here, I'm gonna blow my f**ing brains out" and "It's gonna be all on you and all these other doctors who kicked me out too". I spoke with TTS evaluator to update her on change in demeanor and language. TTS now recommending inpatient treatment and seeking placement.  Final Clinical Impressions(s) / ED Diagnoses   Final diagnoses:  Anxiety  Insomnia, unspecified type     New Prescriptions New Prescriptions   No medications on file       [place], Ozella Almond, PA-C 10/17/16 1116    Malvin Johns, MD 10/17/16 1314

## 2016-10-17 NOTE — ED Triage Notes (Addendum)
Pt has been off Klonopin for the last 2 weeks after taking it for 10 years.  C/o chest pain, sob, and not sleeping.  Seen at [facility] x 2 for same.  Reports syncopal episode earlier today.

## 2016-10-17 NOTE — ED Notes (Signed)
Pt spoke w/boyfriend, Jess Barters, on phone at nurses' desk.
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
## 2016-10-17 NOTE — ED Notes (Signed)
Pt eating snack given. Dinner order request received. Pt remains cooperative and is much calmer now than when first arrived to [place].

## 2016-10-17 NOTE — ED Notes (Addendum)
Pt ambulatory to [facility] F8 w/Sitter and RN. Pt wearing burgundy scrubs.  Wanded by security. Pt noted to be cooperative. States feels anxious and is upset d/t "I keep getting kicked out every time I come". Pt noted w/excessive and repetitive speech. States she does not want to be given anymore Benzos. States "I want my body to learn on its own how to sleep w/o medications". States she has tried exercising and hiking w/no relief. States she has not seen her PCP since she stopped taking them 2 weeks ago d/t he is no longer able to prescribe them d/t "was caught" and the dr she saw after him is no longer w/the practice. States she has 3 sons and boyfriend. States feels their relationship has been strained d/t her not being able to sleep and has been acting bizarre. 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 Pt signed and voiced understanding of Medical Clearance Pt Policy form - copy given. Pt wearing sunglasses and has reading glasses at bedside. Pt's belongings inventoried by Luellen Pucker, RN - 2 labeled belongings bags placed in [place] # 3 and valuables to security. Pt's deodorant and vehicle key placed in labeled belongings bag at nurses' desk - States is allergic to hospital deodorant and her mother is coming to pick up vehicle key.

## 2016-10-17 NOTE — ED Notes (Addendum)
PA asked for pts iv to be taken out for discharge.

## 2016-10-17 NOTE — ED Triage Notes (Signed)
TTS done 

## 2016-10-17 NOTE — ED Notes (Signed)
Pt to shower w/ sitter.

## 2016-10-17 NOTE — ED Notes (Signed)
Pt on phone at nurses' desk. 

## 2016-10-17 NOTE — ED Notes (Signed)
Pt at nurse first cursing at nurse because drink machine is not working.  Pt states it feels like her sugar is dropping and wants her blood sugar and vitals rechecked. EMT gone to get cbg machine and pt started cursing again.  I explained to her that EMT would be right back to check her sugar, vitals, and get her something to drink.

## 2016-10-17 NOTE — ED Notes (Signed)
States does not want to take Trazodone nor Vistaril d/t states made her anxiety worse when took at home. States she also does not like taking Tylenol PM. States she will consider taking Benadryl to assist w/sleeping this pm.

## 2016-10-17 NOTE — ED Triage Notes (Signed)
AT time of DC Pt reported to PA that she was going to blow her head off if she DC.

## 2016-10-17 NOTE — ED Triage Notes (Signed)
PT requesting to talk with PA before DC.

## 2016-10-17 NOTE — ED Notes (Signed)
RN introduced self to pt, indicated she felt better after her shower.  Pt in bed w/ lights off and sunglasses on. Pt states she is very sensitive to light when she does not get sleep.  Reassured pt she was safe to sleep.  Updated pt to plan.  No questions or concerns.

## 2016-10-17 NOTE — ED Notes (Signed)
Pt refused snack 

## 2016-10-17 NOTE — Discharge Instructions (Signed)
It was my pleasure taking care of you today! I hope you begin to feel better soon.  I strongly suggest that you call the clinic listed [facility]) to schedule a follow up appointment. This clinic has counselors and psychiatrists whom you can speak to about your trouble sleeping and further medication management.  Return to the ER for new or worsening symptoms, any additional concerns.

## 2016-10-17 NOTE — ED Notes (Signed)
Informed pt that she had a room and it was being cleaned, pt still very unhappy and states I need to be on a monitor right now.

## 2016-10-17 NOTE — Progress Notes (Signed)
Patient has been recommended psychiatric inpatient treatment per [facility] NP, on 10/17/16.  Patient has been referred for IP treatment at the following facilities: [facility], [place], [facility], [facility], and [facility].  At capacity today: [facility], [facility], [place], [place], [place], [place], and [facility] in disposition will continue to seek placement for patient.  Verlon Setting, [place] Disposition staff 10/17/2016 1:12 PM

## 2016-10-17 NOTE — ED Notes (Signed)
Pt lying on bed watching tv - calm, cooperative.

## 2016-10-17 NOTE — ED Notes (Signed)
Pt noted to be crying and states experiencing panic attack. RN sat w/pt and allowed her to vent her feelings/concerns. States she realizes she "was falling into same cycle as her parents and sister" when she was taking Klonopin. States now she feels like she is able to see what has been going on w/her sons and boyfriend. States she feels overwhelmed, depressed, sad, and anxious over concerns for them. States she feels sad d/t thinks she was a bad single mother to her sons - ages 75, 53, and 64.

## 2016-10-17 NOTE — ED Notes (Addendum)
Key fob given to pt's sister as requested by pt. Pt's sister leaving at this time and states she will be back tomorrow to visit pt if still here. Pt aware may be accepted to [facility] tonight. Pt voices agreement w/tx plan. Pt is eating dinner.

## 2016-10-17 NOTE — ED Notes (Signed)
Pt's sister visiting w/pt. States she is late d/t went to [facility] by mistake.

## 2016-10-17 NOTE — ED Notes (Signed)
Pt aware of need for urine specimen and bloodwork needed for pregnancy test.

## 2016-10-17 NOTE — ED Notes (Signed)
Checked CBG 90, RN Karen informed
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
## 2016-10-18 ENCOUNTER — Encounter (HOSPITAL_COMMUNITY): Payer: Self-pay

## 2016-10-18 ENCOUNTER — Inpatient Hospital Stay (HOSPITAL_COMMUNITY)
Admission: AD | Admit: 2016-10-18 | Discharge: 2016-10-22 | DRG: 880 | Disposition: A | Discharge status: Home / Self Care | Payer: Federal, State, Local not specified - Other | Source: Intra-hospital | Attending: Psychiatry | Admitting: Psychiatry

## 2016-10-18 DIAGNOSIS — Z8249 Family history of ischemic heart disease and other diseases of the circulatory system: Secondary | ICD-10-CM

## 2016-10-18 DIAGNOSIS — G4700 Insomnia, unspecified: Secondary | ICD-10-CM | POA: Diagnosis present

## 2016-10-18 DIAGNOSIS — F411 Generalized anxiety disorder: Secondary | ICD-10-CM

## 2016-10-18 DIAGNOSIS — R197 Diarrhea, unspecified: Secondary | ICD-10-CM | POA: Diagnosis present

## 2016-10-18 DIAGNOSIS — Z62810 Personal history of physical and sexual abuse in childhood: Secondary | ICD-10-CM | POA: Diagnosis present

## 2016-10-18 DIAGNOSIS — Z7982 Long term (current) use of aspirin: Secondary | ICD-10-CM

## 2016-10-18 DIAGNOSIS — M549 Dorsalgia, unspecified: Secondary | ICD-10-CM | POA: Diagnosis present

## 2016-10-18 DIAGNOSIS — Z811 Family history of alcohol abuse and dependence: Secondary | ICD-10-CM | POA: Diagnosis not present

## 2016-10-18 DIAGNOSIS — Z79899 Other long term (current) drug therapy: Secondary | ICD-10-CM

## 2016-10-18 DIAGNOSIS — K219 Gastro-esophageal reflux disease without esophagitis: Secondary | ICD-10-CM | POA: Diagnosis present

## 2016-10-18 DIAGNOSIS — R45851 Suicidal ideations: Secondary | ICD-10-CM | POA: Diagnosis present

## 2016-10-18 DIAGNOSIS — F17210 Nicotine dependence, cigarettes, uncomplicated: Secondary | ICD-10-CM

## 2016-10-18 DIAGNOSIS — F410 Panic disorder [episodic paroxysmal anxiety] without agoraphobia: Principal | ICD-10-CM | POA: Diagnosis present

## 2016-10-18 DIAGNOSIS — F4310 Post-traumatic stress disorder, unspecified: Secondary | ICD-10-CM | POA: Diagnosis present

## 2016-10-18 DIAGNOSIS — F329 Major depressive disorder, single episode, unspecified: Secondary | ICD-10-CM | POA: Diagnosis present

## 2016-10-18 DIAGNOSIS — F1210 Cannabis abuse, uncomplicated: Secondary | ICD-10-CM | POA: Diagnosis present

## 2016-10-18 DIAGNOSIS — Z813 Family history of other psychoactive substance abuse and dependence: Secondary | ICD-10-CM | POA: Diagnosis not present

## 2016-10-18 DIAGNOSIS — F12280 Cannabis dependence with cannabis-induced anxiety disorder: Secondary | ICD-10-CM | POA: Diagnosis not present

## 2016-10-18 LAB — URINALYSIS, COMPLETE (UACMP) WITH MICROSCOPIC
BACTERIA UA: NONE SEEN
Bilirubin Urine: NEGATIVE
Glucose, UA: NEGATIVE mg/dL
Ketones, ur: NEGATIVE mg/dL
Leukocytes, UA: NEGATIVE
Nitrite: NEGATIVE
PROTEIN: NEGATIVE mg/dL
Specific Gravity, Urine: 1.011 (ref 1.005–1.030)
pH: 5 (ref 5.0–8.0)

## 2016-10-18 LAB — CBG MONITORING, ED: GLUCOSE-CAPILLARY: 85 mg/dL (ref 65–99)

## 2016-10-18 MED ORDER — DIPHENHYDRAMINE HCL 25 MG PO CAPS: 25.0000 mg | ORAL_CAPSULE | Freq: Every evening | ORAL | Status: DC | PRN

## 2016-10-18 MED ORDER — ACETAMINOPHEN 325 MG PO TABS
650.0000 mg | ORAL_TABLET | Freq: Four times a day (QID) | ORAL | Status: DC | PRN
  Administered 2016-10-18: 650 mg via ORAL
  Filled 2016-10-18: qty 2

## 2016-10-18 MED ORDER — MAGNESIUM HYDROXIDE 400 MG/5ML PO SUSP: 30.0000 mL | Freq: Every day | ORAL | Status: DC | PRN

## 2016-10-18 MED ORDER — POTASSIUM CHLORIDE CRYS ER 20 MEQ PO TBCR
20.0000 meq | EXTENDED_RELEASE_TABLET | Freq: Two times a day (BID) | ORAL | Status: AC
  Administered 2016-10-18: 20 meq via ORAL
  Filled 2016-10-18 (×4): qty 1

## 2016-10-18 MED ORDER — HYDROXYZINE HCL 25 MG PO TABS
25.0000 mg | ORAL_TABLET | Freq: Four times a day (QID) | ORAL | Status: DC | PRN
  Administered 2016-10-18 – 2016-10-19 (×2): 25 mg via ORAL
  Filled 2016-10-18 (×2): qty 1

## 2016-10-18 MED ORDER — ALUM & MAG HYDROXIDE-SIMETH 200-200-20 MG/5ML PO SUSP
30.0000 mL | ORAL | Status: DC | PRN
  Administered 2016-10-19 – 2016-10-21 (×3): 30 mL via ORAL
  Filled 2016-10-18 (×3): qty 30

## 2016-10-18 MED ORDER — DIPHENHYDRAMINE HCL 25 MG PO CAPS: 25.0000 mg | ORAL_CAPSULE | Freq: Four times a day (QID) | ORAL | Status: DC | PRN

## 2016-10-18 MED ORDER — HYDROXYZINE HCL 25 MG PO TABS
25.0000 mg | ORAL_TABLET | Freq: Three times a day (TID) | ORAL | Status: DC | PRN
  Administered 2016-10-18: 25 mg via ORAL
  Filled 2016-10-18: qty 1

## 2016-10-18 MED ORDER — HYDROCORTISONE 0.5 % EX CREA
TOPICAL_CREAM | Freq: Two times a day (BID) | CUTANEOUS | Status: DC
  Administered 2016-10-19 – 2016-10-21 (×3): via TOPICAL
  Filled 2016-10-18 (×2): qty 28.35

## 2016-10-18 NOTE — [facility] Group Notes (Signed)
[place] Group Notes:  (Nursing/MHT/Case Management/Adjunct)  Date:  10/18/2016  Time:  3:21 PM  Type of Therapy:  Nurse Education  Participation Level: Did not attend  Summary of Progress/Problems:  Group activity whereby each person wrote positive attributes about all other members of the group. Patients then read the positive statements about themselves and described how reading them , changed, or didn't chang their view of them self.   Cheri Kearns 10/18/2016, 3:21 PM

## 2016-10-18 NOTE — Progress Notes (Signed)
Patient has been accepted to [facility], to room 305-2, to Dr. Parke Poisson. Bed is ready and report can be called any time now. [facility]-ED RN [place] has been informed.  Verlon Setting, [place] Disposition staff 10/18/2016 9:37 AM

## 2016-10-18 NOTE — ED Notes (Signed)
Pt voiced understanding and is in agreement w/tx plan - accepted to [facility] 305-2 - Dr Parke Poisson. Pt signing consent forms.

## 2016-10-18 NOTE — Progress Notes (Signed)
Responded to request by Pod F nurse to visit pt. Provided spiritual/emotional support, ministry of presence/touch, and prayer. Pt believes in God, higher power, doesn't know why seems to have lost faith in [place], though is willing to pray to [place], has doubts about religion.   Constantly worried about her family: her mom (who is not in good shape) and her children. Feeling overcome w/ her own weakness, sad she can't take care of them better. Seemed comforted by prayer, became more calm afterwards. Chaplain available for f/u.   10/18/16 0500  Clinical Encounter Type  Visited With Patient;Health care provider  Visit Type Initial;Psychological support;Spiritual support;Social support;ED  Referral From Nurse  Spiritual Encounters  Spiritual Needs Prayer;Emotional  Stress Factors  Patient Stress Factors Health changes;Loss of control   Gerrit Heck, Chaplain

## 2016-10-18 NOTE — H&P (Signed)
Psychiatric Admission Assessment Adult  Patient Identification: Melissa Allison MRN:  [id number] Date of Evaluation:  10/18/2016 Chief Complaint:  GAD Cannabis Use Disorder,moderate Principal Diagnosis: GAD (generalized anxiety disorder) Diagnosis:   Patient Active Problem List   Diagnosis Date Noted  . 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 Aortic dissection, abdominal ([place]) [I71.02] 05/16/2012  . Heart palpitations [R00.2] 04/11/2012  . Well woman exam with routine gynecological exam [W97.989] 10/20/2011  . At risk for hepatitis [Z91.89] 10/20/2011  . Exposure to sexually transmitted disease (STD) [Z20.2] 10/20/2011  . Arm numbness left [R20.0] 10/06/2011  . LSIL (low grade squamous intraepithelial lesion) on Pap smear [IMO0002] 09/09/2011  . Mole of skin [D22.9] 09/09/2011  . Mood disorder ([place]) [F39] 09/09/2011  . GAD (generalized anxiety disorder) [F41.1] 08/04/2007  . OTH SPEC PERS HX PRESENTING HAZARDS HEALTH OTH [Z91.89] 06/23/2007  . HEADACHE [R51] 02/23/2007  . TOBACCO ABUSE [F17.200] 09/10/2006  . GERD [K21.9] 09/10/2006  . ECZEMA [L25.9] 09/10/2006   CC: "I just went through this with the other doctor please dont ask me this again. "  Information obtained from [facility], patient "having panic attack from meeting with doctor."41 year old female, lives with BF and adult son. States she has been on Klonopin for " many years", which she took for insomnia and anxiety. States " I decided to get off it, because " I was feeling numb to everything". States that " I tried to wean myself off . She had been on 2 mgrs total daily dose, and had been cutting gradually and stopped them completely about two weeks ago. She reports her symptoms of benzodiazepine withdrawal have mostly  resolved, but she feels she has continued to have significant symptoms, which she feels may be residual withdrawal.  At this time patient does not  present tremulous, restless or in any acute distress, no diaphoresis, vitals are stable . She reports insomnia, anxiety, difficulty concentrating. States symptoms have been severe and have caused significant disruption to her daily activities .   " Like I have been unable to work lately because I have not been doing well ". Reports increased anxiety, increased frequency of panic attacks, and describes some depression. Reports poor sleep, erratic appetite, low energy level . States " I felt some hopelessness and suicide thoughts because of all the withdrawal and all the anxiety coming back", but denies actual suicidal plan or intention. Of note, patient states that even though she is experiencing increased anxiety, she does not want to go back on Klonopin or any BZD because " I want to feel alive, I don't want to be numb with these meds anymore ".  Patient  Reports history of sevral UTIs in the past, but not recently . At this time denies any symptoms. UA is ( +) for leukocytes, rare bacteria, negative for nitrites .  History of Present Illness: Melissa Allison is an 41 y.o. female. Pt presents voluntarily to [facility] c/o chest pain, sob, and insomnia from Klonopin withdrawa. Per chart review, pt presented to [place] ton 10/11/16 & 10/12/16 for similar symptoms. Pt is anxious and oriented x 4. Pt appears restless and is irritable. Her mood is labile as she sometimes becomes tearful. Pt reports she hasn't slept in two weeks. She says she had been taking Klonopin 0.47m daily for 10 years until she took herself off 2 weeks ago (without consulting her MD). Pt says she hasn't been able to eat. During assessment, pt  begins visibly trembling. She reports she felt she was "numb" during the years of Klonopin use, and she has missed things that were going on around her with her boyfriend and family. Pt says she feels like her boyfriend is doing drugs. She says she visited another son who appeared like he was using drugs too.  Per pt, her mom, sister & dad all have substance abuse. Pt herself states she smokes approx 1 gram THC daily but say she hasn't been able to smoke THC since 10/12/16. Pt sts [facility] prescribed her Klonopin. Pt endorses fatigue, irritability and tearfulness. She reports no hx of outpatient or inpatient [place] treatment. Pt says, "I can't function on my own. I feel like I'm dying." Pt requests to come into an inpatient psychiatric facility. Pt denies SI currently or at any time in the past. Pt denies any history of suicide attempts and denies history of self-mutilation. Pt denies homicidal thoughts or physical aggression. Pt denies having access to firearms. Pt denies having any legal problems at this time. Pt denies hallucinations. Pt does not appear to be responding to internal stimuli and exhibits no delusional thought. Pt's reality testing appears to be intact. [name] [phone]. She reports she herself has "anxiety". Mom says pt called mom "in a total panic."  Upon admission to the unit:  Patient is a 41 year old woman, admitted voluntarily from [facility]. Patient reports that she stopped her night time klonopin, "Two weeks ago. I have been on it for more that ten years and I realized how addicted to it I was and I didn't want to be. So I just stopped it." Since that time patient has been experiencing increased anxiety, very minimal sleep, "Panic attacks", and she reports, "I realized there were so many stressful situations in my family that I wasn't facing when I was on the Klonopin."  Patient told ED, "If I have to leave here, I don't know what will happen." Patient currently is negative for SI/HI/AVH. On skin assessment it was noted patient has multiple tattoos to bilateral arms, legs and torso. She has a piercing to her left upper lip and left forearm. Paperwork completed. Personal belongings secured in locker. Oriented to unit. Q 15 minute checks  initiated  Associated Signs/Symptoms: Depression Symptoms:  depressed mood, insomnia, hopelessness, (Hypo) Manic Symptoms:  Distractibility, Anxiety Symptoms:  Excessive Worry, Panic Symptoms, Social Anxiety, Psychotic Symptoms:  Denies PTSD Symptoms: Negative Total Time spent with patient: 1 hour  Past Psychiatric History: GAD, Polysybstance abuse disorder  Is the patient at risk to self? Yes.    Has the patient been a risk to self in the past 6 months? No.  Has the patient been a risk to self within the distant past? No.  Is the patient a risk to others? No.  Has the patient been a risk to others in the past 6 months? No.  Has the patient been a risk to others within the distant past? Yes.     Prior Inpatient Therapy:   None Prior Outpatient Therapy:  None  Alcohol Screening: Patient refused Alcohol Screening Tool: Yes 1. How often do you have a drink containing alcohol?: Never 9. Have you or someone else been injured as a result of your drinking?: No 10. Has a relative or friend or a doctor or another health worker been concerned about your drinking or suggested you cut down?: No Alcohol Use Disorder Identification Test Final Score (AUDIT): 0  Brief Intervention: Patient declined brief intervention Substance Abuse History in the last 12 months:  Yes.   Consequences of Substance Abuse: Medical Consequences:  Liver damage, Possible death by overdose Legal Consequences:  Arrests, jail time, Loss of driving privilege. Family Consequences:  Family discord, divorce and or separation. Previous Psychotropic Medications: Yes  Psychological Evaluations: Yes  Past Medical History:  Past Medical History:  Diagnosis Date  . Anxiety   . Lymphadenitis     Past Surgical History:  Procedure Laterality Date  . APPENDECTOMY    . CHOLECYSTECTOMY    . FOOT SURGERY    . TUBAL LIGATION    . TUBAL LIGATION    . UTERINE FIBROID SURGERY     cauterization of aneurysm   Family History:  Father has cancer so he is on all kind of pain pills.  Family History  Problem Relation Age of Onset  . Cancer Father        prostate  . Heart disease Maternal Grandmother        unsure   Family Psychiatric  History: Mother - poly substance abuse and alcohol abuse( while pregnant). Sister-polysubstance abuse Father- polysubstance and alcohol abuse.  Tobacco Screening: Have you used any form of tobacco in the last 30 days? (Cigarettes, Smokeless Tobacco, Cigars, and/or Pipes): Yes Tobacco use, Select all that apply: 5 or more cigarettes per day Are you interested in Tobacco Cessation Medications?: No, patient refused Counseled patient on smoking cessation including recognizing danger situations, developing coping skills and basic information about quitting provided: Refused/Declined practical counseling Social History:  History  Alcohol Use  . 0.0 oz/week    Comment: twice per month     History  Drug Use  . Types: Marijuana    Additional Social History:                           Allergies:   Allergies  Allergen Reactions  . Sulfa Antibiotics Rash  . Cephalexin     REACTION: tongue swelling  . Conjugated Estrogens     REACTION: unspecified  . Cyclobenzaprine Hcl     REACTION: unspecified  . Ephedrine   . Ephedrine Sulfate   . Rabeprazole Sodium     REACTION: unspecified  . Sulfonamide Derivatives     REACTION: unspecified   Lab Results:  Results for orders placed or performed during the hospital encounter of 10/17/16 (from the past 48 hour(s))  Basic metabolic panel     Status: Abnormal   Collection Time: 10/17/16 12:46 AM  Result Value Ref Range   Sodium 134 (L) 135 - 145 mmol/L   Potassium 3.1 (L) 3.5 - 5.1 mmol/L   Chloride 104 101 - 111 mmol/L   CO2 22 22 - 32 mmol/L   Glucose, Bld 107 (H) 65 - 99 mg/dL   BUN 9 6 - 20 mg/dL   Creatinine, Ser 0.75 0.44 - 1.00 mg/dL   Calcium 8.8 (L) 8.9 - 10.3 mg/dL   GFR calc non Af Amer >60 >60 mL/min   GFR calc  Af Amer >60 >60 mL/min    Comment: (NOTE) The eGFR has been calculated using the CKD EPI equation. This calculation has not been validated in all clinical situations. eGFR's persistently <60 mL/min signify possible Chronic Kidney Disease.    Anion gap 8 5 - 15  CBC     Status: Abnormal   Collection Time: 10/17/16 12:46 AM  Result Value Ref Range   WBC  9.8 4.0 - 10.5 K/uL   RBC 3.75 (L) 3.87 - 5.11 MIL/uL   Hemoglobin 12.5 12.0 - 15.0 g/dL   HCT 36.2 36.0 - 46.0 %   MCV 96.5 78.0 - 100.0 fL   MCH 33.3 26.0 - 34.0 pg   MCHC 34.5 30.0 - 36.0 g/dL   RDW 13.2 11.5 - 15.5 %   Platelets 242 150 - 400 K/uL  I-stat troponin, ED     Status: None   Collection Time: 10/17/16 12:59 AM  Result Value Ref Range   Troponin i, poc 0.00 0.00 - 0.08 ng/mL   Comment 3            Comment: Due to the release kinetics of cTnI, a negative result within the first hours of the onset of symptoms does not rule out myocardial infarction with certainty. If myocardial infarction is still suspected, repeat the test at appropriate intervals.   CBG monitoring, ED     Status: None   Collection Time: 10/17/16  4:44 AM  Result Value Ref Range   Glucose-Capillary 90 65 - 99 mg/dL   Comment 1 Notify RN   I-stat troponin, ED     Status: None   Collection Time: 10/17/16  8:41 AM  Result Value Ref Range   Troponin i, poc 0.00 0.00 - 0.08 ng/mL   Comment 3            Comment: Due to the release kinetics of cTnI, a negative result within the first hours of the onset of symptoms does not rule out myocardial infarction with certainty. If myocardial infarction is still suspected, repeat the test at appropriate intervals.   Ethanol     Status: None   Collection Time: 10/17/16  7:15 PM  Result Value Ref Range   Alcohol, Ethyl (B) <5 <5 mg/dL    Comment:        LOWEST DETECTABLE LIMIT FOR SERUM ALCOHOL IS 5 mg/dL FOR MEDICAL PURPOSES ONLY   Acetaminophen level     Status: Abnormal   Collection Time: 10/17/16   7:15 PM  Result Value Ref Range   Acetaminophen (Tylenol), Serum <10 (L) 10 - 30 ug/mL    Comment:        THERAPEUTIC CONCENTRATIONS VARY SIGNIFICANTLY. A RANGE OF 10-30 ug/mL MAY BE AN EFFECTIVE CONCENTRATION FOR MANY PATIENTS. HOWEVER, SOME ARE BEST TREATED AT CONCENTRATIONS OUTSIDE THIS RANGE. ACETAMINOPHEN CONCENTRATIONS >150 ug/mL AT 4 HOURS AFTER INGESTION AND >50 ug/mL AT 12 HOURS AFTER INGESTION ARE OFTEN ASSOCIATED WITH TOXIC REACTIONS.   Salicylate level     Status: None   Collection Time: 10/17/16  7:15 PM  Result Value Ref Range   Salicylate Lvl <6.2 2.8 - 30.0 mg/dL  I-Stat beta hCG blood, ED     Status: None   Collection Time: 10/17/16  7:22 PM  Result Value Ref Range   I-stat hCG, quantitative <5.0 <5 mIU/mL   Comment 3            Comment:   GEST. AGE      CONC.  (mIU/mL)   <=1 WEEK        5 - 50     2 WEEKS       50 - 500     3 WEEKS       100 - 10,000     4 WEEKS     1,000 - 30,000        FEMALE AND NON-PREGNANT FEMALE:     LESS  THAN 5 mIU/mL   Urine rapid drug screen (hosp performed)     Status: Abnormal   Collection Time: 10/17/16  7:48 PM  Result Value Ref Range   Opiates NONE DETECTED NONE DETECTED   Cocaine NONE DETECTED NONE DETECTED   Benzodiazepines NONE DETECTED NONE DETECTED   Amphetamines NONE DETECTED NONE DETECTED   Tetrahydrocannabinol POSITIVE (A) NONE DETECTED   Barbiturates NONE DETECTED NONE DETECTED    Comment:        DRUG SCREEN FOR MEDICAL PURPOSES ONLY.  IF CONFIRMATION IS NEEDED FOR ANY PURPOSE, NOTIFY LAB WITHIN 5 DAYS.        LOWEST DETECTABLE LIMITS FOR URINE DRUG SCREEN Drug Class       Cutoff (ng/mL) Amphetamine      1000 Barbiturate      200 Benzodiazepine   017 Tricyclics       510 Opiates          300 Cocaine          300 THC              50   CBG monitoring, ED     Status: None   Collection Time: 10/18/16  4:04 AM  Result Value Ref Range   Glucose-Capillary 85 65 - 99 mg/dL    Blood Alcohol level: See  MD SRA Lab Results  Component Value Date   ETH <5 10/17/2016   ETH <5 25/85/2778    Metabolic Disorder Labs:  No results found for: HGBA1C, MPG No results found for: PROLACTIN No results found for: CHOL, TRIG, HDL, CHOLHDL, VLDL, LDLCALC  Current Medications: Current Facility-Administered Medications  Medication Dose Route Frequency Provider Last Rate Last Dose  . acetaminophen (TYLENOL) tablet 650 mg  650 mg Oral Q6H PRN Okonkwo, Justina A, NP      . alum & mag hydroxide-simeth (MAALOX/MYLANTA) 200-200-20 MG/5ML suspension 30 mL  30 mL Oral Q4H PRN Okonkwo, Justina A, NP      . hydrOXYzine (ATARAX/VISTARIL) tablet 25 mg  25 mg Oral Q6H PRN Cobos, Fernando A, MD      . magnesium hydroxide (MILK OF MAGNESIA) suspension 30 mL  30 mL Oral Daily PRN Okonkwo, Justina A, NP      . potassium chloride SA (K-DUR,KLOR-CON) CR tablet 20 mEq  20 mEq Oral BID Cobos, Myer Peer, MD       PTA Medications: Prescriptions Prior to Admission  Medication Sig Dispense Refill Last Dose  . acetaminophen (TYLENOL) 325 MG tablet Take 650 mg by mouth every 6 (six) hours as needed for mild pain.   10/16/2016 at Unknown time  . aspirin EC 325 MG tablet Take 325 mg by mouth every 6 (six) hours as needed for mild pain.   10/16/2016 at Unknown time  . cholecalciferol (VITAMIN D) 1000 units tablet Take 1,000 Units by mouth daily.   4-5 days  . clonazePAM (KLONOPIN) 0.5 MG tablet Take 1 tablet (0.5 mg total) by mouth 2 (two) times daily as needed for anxiety. 20 tablet 0 2 weeks  . hydrOXYzine (ATARAX/VISTARIL) 25 MG tablet Take 1 tablet (25 mg total) by mouth every 6 (six) hours as needed for anxiety. 20 tablet 0 10/17/2016 at 0400  . ibuprofen (ADVIL,MOTRIN) 200 MG tablet Take 400 mg by mouth every 6 (six) hours as needed. pain   PRN  . Omega-3 Fatty Acids (FISH OIL) 1000 MG CAPS Take 1 capsule by mouth daily.   1-2 weeks  . omeprazole (PRILOSEC) 20 MG capsule TAKE  1 CAPSULE EVERY DAY (Patient not taking: Reported on  10/11/2016) 30 capsule 7 Not Taking at Unknown time  . S-Adenosylmethionine-B6-B12-FA (MOOD PLUS STRESS RELIEF PO) Take 1 tablet by mouth daily as needed (lower stress).   4-5 days  . traZODone (DESYREL) 50 MG tablet Take 1 tablet (50 mg total) by mouth at bedtime as needed for sleep. (Patient not taking: Reported on 10/17/2016) 10 tablet 0 Not Taking at Unknown time  . zolpidem (AMBIEN) 5 MG tablet Take 1 tablet (5 mg total) by mouth at bedtime as needed for sleep. (Patient not taking: Reported on 10/17/2016) 7 tablet 0 Not Taking at Unknown time    Musculoskeletal: Strength & Muscle Tone: within normal limits Gait & Station: normal Patient leans: N/A  Psychiatric Specialty Exam: Physical Exam  ROS  Blood pressure 118/78, pulse 66, temperature 98.8 F (37.1 C), temperature source Oral, resp. rate 16, height 5' 2"  (1.575 m), weight 45.4 kg (100 lb), last menstrual period 10/16/2016, SpO2 100 %.Body mass index is 18.29 kg/m.  General Appearance: Fairly Groomed  Eye Contact:  Minimal  Speech:  Clear and Coherent and Normal Rate  Volume:  Normal  Mood:  Anxious and Depressed  Affect:  Depressed and Flat  Thought Process:  Linear and Descriptions of Associations: Intact  Orientation:  Full (Time, Place, and Person)  Thought Content:  Logical  Suicidal Thoughts:  No  Homicidal Thoughts:  No  Memory:  Immediate;   Fair Recent;   Fair  Judgement:  Intact  Insight:  Fair  Psychomotor Activity:  Normal  Concentration:  Concentration: Fair and Attention Span: Fair  Recall:  [employer] of Knowledge:  Fair  Language:  Fair  Akathisia:  No  Handed:  Right  AIMS (if indicated):     Assets:  Communication Skills Desire for Improvement Financial Resources/Insurance Housing Leisure Time Physical Health Social Support Vocational/Educational  ADL's:  Intact  Cognition:  WNL  Sleep:       Treatment Plan Summary: Daily contact with patient to assess and evaluate symptoms and progress in  treatment and Medication management 1. Will maintain Q 15 minutes observation for safety. Estimated LOS: 5-7 days 2. Patient will participate in group, milieu, and family therapy. Psychotherapy: Social and Airline pilot, anti-bullying, learning based strategies, cognitive behavioral, and family object relations individuation separation intervention psychotherapies can be considered.  3. She declined detox taper for ativan on admission.   4. Will continue to monitor patient's mood and behavior. 5. Social Work will schedule a Family meeting to obtain collateral information and discuss discharge and follow up plan. Discharge concerns will also be addressed: Safety, stabilization, and access to medication Observation Level/Precautions:  15 minute checks  Laboratory:  labs obtained in the ED have been reviewwed and assessed,   Psychotherapy:  Invidiously and group therapy.  Medications:  See above  Consultations:  Per need  Discharge Concerns:  Safety, access to drugs  Estimated LOS: 3-5 days   Other:     Physician Treatment Plan for Primary Diagnosis: <principal problem not specified> Long Term Goal(s): Improvement in symptoms so as ready for discharge  Short Term Goals: Ability to identify changes in lifestyle to reduce recurrence of condition will improve, Ability to verbalize feelings will improve, Ability to disclose and discuss suicidal ideas and Ability to demonstrate self-control will improve  Physician Treatment Plan for Secondary Diagnosis: Active Problems:   GAD (generalized anxiety disorder)  Long Term Goal(s): Improvement in symptoms so as ready for discharge  Short Term Goals: Ability to identify and develop effective coping behaviors will improve, Ability to maintain clinical measurements within normal limits will improve and Compliance with prescribed medications will improve  I certify that inpatient services furnished can reasonably be expected to improve  the patient's condition.    Nanci Pina, FNP 7/15/20184:49 PM

## 2016-10-18 NOTE — Progress Notes (Signed)
Patient ID: Mariane Duval, female   DOB: 07-31-1975, 41 y.o.   MRN: [id number]  Pt currently presents with a flat affect and anxious behavior. Pt reports to writer that their goal is to "I am trying to do this my way, I need support, faith support." Pt states "I don't want to take any medications for sleep," pt reports chronic insomnia. Pt reports poor sleep with current medication regimen.   Pt provided with medications per providers orders. Pt's labs and vitals were monitored throughout the night. Pt given a 1:1 about emotional and mental status. Pt supported and encouraged to express concerns and questions. Pt educated on medications and guided imagery.   Pt's safety ensured with 15 minute and environmental checks. Pt currently denies SI/HI and A/V hallucinations. Pt verbally agrees to seek staff if SI/HI or A/VH occurs and to consult with staff before acting on any harmful thoughts. Will continue POC.

## 2016-10-18 NOTE — BHH Counselor (Signed)
Unsuccessful attempt to complete PSA with patient who declined as "Im having a panic attack right now."  Patient was observed calmly eating peanuts in her room yet was unwilling to give conversation an attempt as she reported "I need to be alone."   Sheilah Pigeon, LCSW

## 2016-10-18 NOTE — ED Notes (Signed)
Pt ambulated to the bathroom and back to bed.  She called RN and told her that she just could not sleep, could not stop worrying about her family.  RN attempted to calm her down, took vitals which were well w/in normal limits.  Pt reported she was weak, RN tested CBG, again, WNL.  RN gave pt medication to assist w/ anxiety and sleep.

## 2016-10-18 NOTE — Progress Notes (Signed)
Pt attended AA meeting this evening.  

## 2016-10-18 NOTE — ED Notes (Signed)
Pt walking on the outside of room - returned to room as asked by RN. Pt noted to be calm, cooperative. States feels she needs to stretcher her legs in the hopes she will be able to sleep d/t not sleeping.

## 2016-10-18 NOTE — Tx Team (Signed)
Initial Treatment Plan 10/18/2016 1:15 PM Melissa Allison BOB:[id number]    PATIENT STRESSORS: Substance abuse   PATIENT STRENGTHS: Ability for insight Motivation for treatment/growth   PATIENT IDENTIFIED PROBLEMS: "Learn how to sleep without medication"  "Manage my panic attacks""  "Manage my worry"  "Love myself"               DISCHARGE CRITERIA:  Improved stabilization in mood, thinking, and/or behavior  PRELIMINARY DISCHARGE PLAN: Attend 12-step recovery group  PATIENT/FAMILY INVOLVEMENT: This treatment plan has been presented to and reviewed with the patient, Melissa Allison.  The patient has been given the opportunity to ask questions and make suggestions.  Cheri Kearns, RN 10/18/2016, 1:15 PM

## 2016-10-18 NOTE — ED Notes (Addendum)
Sitter informed RN that pt wanted Ibprofen b/c pt's stomach hurt.  RN went to speak to pt to get more information.  When RN returned w/ medicine pt became anxious that the Ibprofen would "mess up my stomach."  Pt declined medicine
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
## 2016-10-18 NOTE — ED Notes (Signed)
Copy of consent form faxed to [facility], copy to medical records, and original placed in envelope for [facility].

## 2016-10-18 NOTE — ED Notes (Signed)
Breakfast delivered  

## 2016-10-18 NOTE — ED Notes (Signed)
Pt requested to speak to [place], requested services, Mable Fill will be to see pt shortly.  Pt advised.

## 2016-10-18 NOTE — ED Notes (Signed)
Pt on phone at nurses' desk. 

## 2016-10-18 NOTE — Progress Notes (Signed)
Patient is a 41 year old woman, admitted voluntarily from [facility] Patient reports that she stopped her night time klonopin, "Two weeks ago. I have been on it for more that ten years and I realized how addicted to it I was and I didn't want to be. So I just stopped it." Since that time patient has been experiencing increased anxiety, very minimal sleep, "Panic attacks", and she reports, "I realized there were so many stressful situations in my family that I wasn't facing when I was on the Klonopin."  Patient told ED, "If I have to leave here, I don't know what will happen." Patient currently is negative for SI/HI/AVH. On skin assessment it was noted patient has multiple tattoos to bilateral arms, legs and torso. She has a piercing to her left upper lip and left forearm. Paperwork completed. Personal belongings secured in locker. Oriented to unit. Q 15 minute checks initiated

## 2016-10-18 NOTE — ED Notes (Signed)
Watchtower bedside

## 2016-10-18 NOTE — ED Notes (Signed)
Pt alert and very anxious stating she feels like her "organs are shutting down."  RN assured her that her vitals were good (retaken).  Pt states she can not "shut down her brain."  RN reassured pt and was able to calm her down using therapeutic communication.

## 2016-10-19 LAB — COMPREHENSIVE METABOLIC PANEL
ALK PHOS: 53 U/L (ref 38–126)
ALT: 12 U/L — AB (ref 14–54)
AST: 18 U/L (ref 15–41)
Albumin: 4.7 g/dL (ref 3.5–5.0)
Anion gap: 8 (ref 5–15)
BILIRUBIN TOTAL: 0.5 mg/dL (ref 0.3–1.2)
BUN: 12 mg/dL (ref 6–20)
CALCIUM: 8.9 mg/dL (ref 8.9–10.3)
CO2: 24 mmol/L (ref 22–32)
CREATININE: 0.71 mg/dL (ref 0.44–1.00)
Chloride: 107 mmol/L (ref 101–111)
GFR calc Af Amer: >60 mL/min (ref >60)
Glucose, Bld: 111 mg/dL — ABNORMAL HIGH (ref 65–99)
POTASSIUM: 3.7 mmol/L (ref 3.5–5.1)
Sodium: 139 mmol/L (ref 135–145)
TOTAL PROTEIN: 7.4 g/dL (ref 6.5–8.1)

## 2016-10-19 LAB — LIPID PANEL
CHOLESTEROL: 149 mg/dL (ref 0–200)
HDL: 64 mg/dL (ref >40)
LDL Cholesterol: 73 mg/dL (ref 0–99)
Total CHOL/HDL Ratio: 2.3 RATIO
Triglycerides: 60 mg/dL (ref <150)
VLDL: 12 mg/dL (ref 0–40)

## 2016-10-19 LAB — TSH: TSH: 1.818 u[IU]/mL (ref 0.350–4.500)

## 2016-10-19 MED ORDER — LOPERAMIDE HCL 2 MG PO CAPS
2.0000 mg | ORAL_CAPSULE | ORAL | Status: DC | PRN
  Administered 2016-10-20 – 2016-10-21 (×2): 2 mg via ORAL
  Filled 2016-10-19 (×2): qty 1

## 2016-10-19 MED ORDER — QUETIAPINE FUMARATE 50 MG PO TABS
50.0000 mg | ORAL_TABLET | Freq: Once | ORAL | Status: AC
  Administered 2016-10-19: 50 mg via ORAL
  Filled 2016-10-19: qty 1

## 2016-10-19 MED ORDER — VITAMIN D3 25 MCG (1000 UNIT) PO TABS
1000.0000 [IU] | ORAL_TABLET | Freq: Every day | ORAL | Status: DC
  Administered 2016-10-20 – 2016-10-22 (×3): 1000 [IU] via ORAL
  Filled 2016-10-19 (×3): qty 1
  Filled 2016-10-19: qty 7
  Filled 2016-10-19: qty 1

## 2016-10-19 MED ORDER — SERTRALINE HCL 50 MG PO TABS
ORAL_TABLET | ORAL | Status: AC
  Filled 2016-10-19: qty 1

## 2016-10-19 MED ORDER — LOPERAMIDE HCL 2 MG PO CAPS
4.0000 mg | ORAL_CAPSULE | Freq: Once | ORAL | Status: AC
  Administered 2016-10-19: 4 mg via ORAL
  Filled 2016-10-19 (×2): qty 2

## 2016-10-19 MED ORDER — LORAZEPAM 0.5 MG PO TABS
0.5000 mg | ORAL_TABLET | Freq: Four times a day (QID) | ORAL | Status: DC | PRN
  Administered 2016-10-19 – 2016-10-20 (×3): 0.5 mg via ORAL
  Filled 2016-10-19 (×3): qty 1

## 2016-10-19 MED ORDER — OMEGA-3-ACID ETHYL ESTERS 1 G PO CAPS
1.0000 g | ORAL_CAPSULE | Freq: Every day | ORAL | Status: DC
  Administered 2016-10-20 – 2016-10-22 (×3): 1 g via ORAL
  Filled 2016-10-19 (×4): qty 1
  Filled 2016-10-19: qty 14
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
## 2016-10-19 NOTE — Progress Notes (Signed)
  Pt approaches nurses station with complaints of anxiety and insomnia. Pt preoccupied with past experiences that have caused her insomnia in the past. Pt voices irrational fears of the hallway and others on the unit. Pt given a 1:1 about concerns, educated on medications and cognitive behavioral therapy. Pt asks "Can I take a Lunesta, something like that?" Provider consulted, see MAR. Encouraged to return to bed, will continue to monitor.

## 2016-10-19 NOTE — Progress Notes (Signed)
Patient ID: Melissa Allison, female   DOB: 1975/08/29, 41 y.o.   MRN: [id number]  Pt approaches nurses station complaining of on going anxiety and "fear of being around strangers." Pt requests a single bed room, notified that no such bed is currently available. Notified of the quiet room, pt refuses. Pt speech is quiet and logical, affect is calm, breathing is WDL. Pt reports she is having a "panic attack" and may "pass out while I am pacing my room." Refuses medications. Charge nurse notified of pts reports, monitoring patient at nurses station until anxiety lessens. Pt returns to her room, agrees not to pace and to remain in bed for the night. Will continue to monitor.

## 2016-10-19 NOTE — Progress Notes (Signed)
Patient ID: Melissa Allison, female   DOB: 1975/11/02, 41 y.o.   MRN: [id number]  Patient signed 72 hour request for discharge on 10/19/2016 at 1113.

## 2016-10-19 NOTE — Tx Team (Signed)
Interdisciplinary Treatment and Diagnostic Plan Update  10/19/2016 Time of Session: 0930AM Melissa Allison MRN: [id number]  Principal Diagnosis: GAD (generalized anxiety disorder)  Secondary Diagnoses: Principal Problem:   GAD (generalized anxiety disorder)   Current Medications:  Current Facility-Administered Medications  Medication Dose Route Frequency Provider Last Rate Last Dose  . acetaminophen (TYLENOL) tablet 650 mg  650 mg Oral Q6H PRN Okonkwo, Justina A, NP   650 mg at 10/18/16 2243  . alum & mag hydroxide-simeth (MAALOX/MYLANTA) 200-200-20 MG/5ML suspension 30 mL  30 mL Oral Q4H PRN Okonkwo, Justina A, NP      . hydrocortisone cream 0.5 %   Topical BID Berry, Jason A, NP      . hydrOXYzine (ATARAX/VISTARIL) tablet 25 mg  25 mg Oral Q6H PRN Cobos, Fernando A, MD   25 mg at 10/19/16 0610  . loperamide (IMODIUM) capsule 2 mg  2 mg Oral PRN Cobos, Fernando A, MD      . loperamide (IMODIUM) capsule 4 mg  4 mg Oral Once Cobos, Fernando A, MD      . magnesium hydroxide (MILK OF MAGNESIA) suspension 30 mL  30 mL Oral Daily PRN Okonkwo, Justina A, NP      . potassium chloride SA (K-DUR,KLOR-CON) CR tablet 20 mEq  20 mEq Oral BID Cobos, Fernando A, MD   20 mEq at 10/18/16 1814   PTA Medications: Prescriptions Prior to Admission  Medication Sig Dispense Refill Last Dose  . acetaminophen (TYLENOL) 325 MG tablet Take 650 mg by mouth every 6 (six) hours as needed for mild pain.   10/16/2016 at Unknown time  . aspirin EC 325 MG tablet Take 325 mg by mouth every 6 (six) hours as needed for mild pain.   10/16/2016 at Unknown time  . cholecalciferol (VITAMIN D) 1000 units tablet Take 1,000 Units by mouth daily.   4-5 days  . clonazePAM (KLONOPIN) 0.5 MG tablet Take 1 tablet (0.5 mg total) by mouth 2 (two) times daily as needed for anxiety. 20 tablet 0 2 weeks  . hydrOXYzine (ATARAX/VISTARIL) 25 MG tablet Take 1 tablet (25 mg total) by mouth every 6 (six) hours as needed for anxiety. 20 tablet 0  10/17/2016 at 0400  . ibuprofen (ADVIL,MOTRIN) 200 MG tablet Take 400 mg by mouth every 6 (six) hours as needed. pain   PRN  . Omega-3 Fatty Acids (FISH OIL) 1000 MG CAPS Take 1 capsule by mouth daily.   1-2 weeks  . omeprazole (PRILOSEC) 20 MG capsule TAKE 1 CAPSULE EVERY DAY (Patient not taking: Reported on 10/11/2016) 30 capsule 7 Not Taking at Unknown time  . S-Adenosylmethionine-B6-B12-FA (MOOD PLUS STRESS RELIEF PO) Take 1 tablet by mouth daily as needed (lower stress).   4-5 days  . traZODone (DESYREL) 50 MG tablet Take 1 tablet (50 mg total) by mouth at bedtime as needed for sleep. (Patient not taking: Reported on 10/17/2016) 10 tablet 0 Not Taking at Unknown time  . zolpidem (AMBIEN) 5 MG tablet Take 1 tablet (5 mg total) by mouth at bedtime as needed for sleep. (Patient not taking: Reported on 10/17/2016) 7 tablet 0 Not Taking at Unknown time    Patient Stressors: Substance abuse  Patient Strengths: Ability for insight Motivation for treatment/growth  Treatment Modalities: Medication Management, Group therapy, Case management,  1 to 1 session with clinician, Psychoeducation, Recreational therapy.   Physician Treatment Plan for Primary Diagnosis: GAD (generalized anxiety disorder) Long Term Goal(s): Improvement in symptoms so as ready for discharge Improvement in   symptoms so as ready for discharge   Short Term Goals: Ability to identify changes in lifestyle to reduce recurrence of condition will improve Ability to verbalize feelings will improve Ability to disclose and discuss suicidal ideas Ability to demonstrate self-control will improve Ability to identify and develop effective coping behaviors will improve Ability to maintain clinical measurements within normal limits will improve Compliance with prescribed medications will improve  Medication Management: Evaluate patient's response, side effects, and tolerance of medication regimen.  Therapeutic Interventions: 1 to 1  sessions, Unit Group sessions and Medication administration.  Evaluation of Outcomes: Not Met  Physician Treatment Plan for Secondary Diagnosis: Principal Problem:   GAD (generalized anxiety disorder)  Long Term Goal(s): Improvement in symptoms so as ready for discharge Improvement in symptoms so as ready for discharge   Short Term Goals: Ability to identify changes in lifestyle to reduce recurrence of condition will improve Ability to verbalize feelings will improve Ability to disclose and discuss suicidal ideas Ability to demonstrate self-control will improve Ability to identify and develop effective coping behaviors will improve Ability to maintain clinical measurements within normal limits will improve Compliance with prescribed medications will improve     Medication Management: Evaluate patient's response, side effects, and tolerance of medication regimen.  Therapeutic Interventions: 1 to 1 sessions, Unit Group sessions and Medication administration.  Evaluation of Outcomes: Not Met   RN Treatment Plan for Primary Diagnosis: GAD (generalized anxiety disorder) Long Term Goal(s): Knowledge of disease and therapeutic regimen to maintain health will improve  Short Term Goals: Ability to remain free from injury will improve, Ability to verbalize feelings will improve and Ability to disclose and discuss suicidal ideas  Medication Management: RN will administer medications as ordered by provider, will assess and evaluate patient's response and provide education to patient for prescribed medication. RN will report any adverse and/or side effects to prescribing provider.  Therapeutic Interventions: 1 on 1 counseling sessions, Psychoeducation, Medication administration, Evaluate responses to treatment, Monitor vital signs and CBGs as ordered, Perform/monitor CIWA, COWS, AIMS and Fall Risk screenings as ordered, Perform wound care treatments as ordered.  Evaluation of Outcomes: Not  Met   LCSW Treatment Plan for Primary Diagnosis: GAD (generalized anxiety disorder) Long Term Goal(s): Safe transition to appropriate next level of care at discharge, Engage patient in therapeutic group addressing interpersonal concerns.  Short Term Goals: Engage patient in aftercare planning with referrals and resources, Facilitate patient progression through stages of change regarding substance use diagnoses and concerns and Identify triggers associated with mental health/substance abuse issues  Therapeutic Interventions: Assess for all discharge needs, 1 to 1 time with Social worker, Explore available resources and support systems, Assess for adequacy in community support network, Educate family and significant other(s) on suicide prevention, Complete Psychosocial Assessment, Interpersonal group therapy.  Evaluation of Outcomes: Not Met   Progress in Treatment: Attending groups: No. New to unit. Continuing to assess.  Participating in groups: No. Taking medication as prescribed: Yes. Toleration medication: No. Family/Significant other contact made: No, will contact:  pt's mother for collateral information/to complete SPE. Patient understands diagnosis: Yes. Discussing patient identified problems/goals with staff: Yes. Medical problems stabilized or resolved: Yes. Denies suicidal/homicidal ideation: Yes. Issues/concerns per patient self-inventory: No. Other: n/a   New problem(s) identified: No, Describe:  n/a  New Short Term/Long Term Goal(s): benzo detox, medication stabilization, development of comprhensive mental wellness/sobriety plan.   Discharge Plan or Barriers:  CSW assessing for appropriate referrals. Pt has PCP only that has been prescribing Klonopin  x 10 years.   Reason for Continuation of Hospitalization: Anxiety Depression Medication stabilization Withdrawal symptoms  Estimated Length of Stay: Tentative discharge on Wed, 10/21/16  Attendees: Patient: 10/19/2016  8:48 AM  Physician: Dr. Cobos MD 10/19/2016 8:48 AM  Nursing: Christa RN 10/19/2016 8:48 AM  RN Care Manager: X 10/19/2016 8:48 AM  Social Worker:  Smart, LCSW 10/19/2016 8:48 AM  Recreational Therapist: x 10/19/2016 8:48 AM  Other: Tanika Lewis NP 10/19/2016 8:48 AM  Other:  10/19/2016 8:48 AM  Other: 10/19/2016 8:48 AM    Scribe for Treatment Team:  N Smart, LCSW 10/19/2016 8:48 AM 

## 2016-10-19 NOTE — Progress Notes (Signed)
Recreation Therapy Notes  Date: 10/19/2016 Time:9:30am Location: [address] Dayroom  Group Topic: Stress Management  Goal Area(s) Addresses:  Patient will verbalize importance of using healthy stress management.  Patient will identify positive emotions associated with healthy stress management.   Intervention: Stress Management  Activity: Mindful Meditation. Recreation Therapy Intern introduced the stress management technique of meditation. Recreation Therapy Intern played a mindful meditation video that focused on deep breathing and forgetting about their worries. Patients were to follow along at the video was played to engage in the activity.  Education:Stress Management, Discharge Planning.   Education Outcome:Acknowledges edcuation  Clinical Observations/Feedback:Pt did not attend group.  Donovan Kail, Recreation Therapy Intern

## 2016-10-19 NOTE — Progress Notes (Signed)
Patient ID: Melissa Allison, female   DOB: June 03, 1975, 41 y.o.   MRN: [id number]  DAR: Pt. Denies SI/HI and A/V Hallucinations. She reports sleep is poor, appetite is poor, energy level is low, and concentration is poor. She rates depression 8/10, hopelessness 8/10, and anxiety 10/10. Patient reports many somatic complaints today. She reports high anxiety, heartburn, diarrhea, cramping, chilling, irritability, agitation, panic attacks, and no sleep "for fifteen days." Support and encouragement provided to the patient although patient is not open to it this morning. Patient is guarded and is staff splitting as evidenced by patient talking about staff members to other members of the interdisciplinary team. Patient is seen in the milieu intermittently. She initially is electively mute this evening with Probation officer but does eventually speak about her medications. She requests to have her evening KDUR moved to 2000. Patient refused morning dose due to "diarrhea." Patient received a dose of Imodium and reported relief. She reports some lightheadedness and dizziness. Vital signs are WDL and her gait is steady. Q15 minute checks are maintained for safety.

## 2016-10-19 NOTE — [facility] Group Notes (Signed)
[facility] LCSW Group Therapy  10/19/2016 1:46 PM  Type of Therapy:  Group Therapy  Participation Level:  Did Not Attend-pt invited. Chose to remain in bed.   Summary of Progress/Problems: Today's Topic: Overcoming Obstacles. Patients identified one short term goal and potential obstacles in reaching this goal. Patients processed barriers involved in overcoming these obstacles. Patients identified steps necessary for overcoming these obstacles and explored motivation (internal and external) for facing these difficulties head on.   Melissa Allison N Smart LCSW 10/19/2016, 1:46 PM

## 2016-10-19 NOTE — B[facility] Suicide Risk Assessment (Signed)
[facility] INPATIENT:  Family/Significant Other Suicide Prevention Education  Suicide Prevention Education:  Education Completed; Earlean Polka (pt's mother) [phone] has been identified by the patient as the family member/significant other with whom the patient will be residing, and identified as the person(s) who will aid the patient in the event of a mental health crisis (suicidal ideations/suicide attempt).  With written consent from the patient, the family member/significant other has been provided the following suicide prevention education, prior to the and/or following the discharge of the patient.  The suicide prevention education provided includes the following:  Suicide risk factors  Suicide prevention and interventions  National Suicide Hotline telephone number  [facility] assessment telephone number  [facility] Emergency Assistance [place] and/or Residential Mobile Crisis Unit telephone number  Request made of family/significant other to:  Remove weapons (e.g., guns, rifles, knives), all items previously/currently identified as safety concern.    Remove drugs/medications (over-the-counter, prescriptions, illicit drugs), all items previously/currently identified as a safety concern.  The family member/significant other verbalizes understanding of the suicide prevention education information provided.  The family member/significant other agrees to remove the items of safety concern listed above.  Pt's mother has no concerns regarding patient safety. SPE reviewed; aftercare plan reviewed. Pt's mother plans to be supportive of patient at discharge and offered her home if patient wants to move in with her permanently.   Faythe Heitzenrater N Smart LCSW 10/19/2016, 2:28 PM
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
## 2016-10-20 DIAGNOSIS — Z79899 Other long term (current) drug therapy: Secondary | ICD-10-CM

## 2016-10-20 DIAGNOSIS — K219 Gastro-esophageal reflux disease without esophagitis: Secondary | ICD-10-CM

## 2016-10-20 LAB — URINE CULTURE: CULTURE: NO GROWTH

## 2016-10-20 LAB — HEMOGLOBIN A1C
Hgb A1c MFr Bld: 4.7 % — ABNORMAL LOW (ref 4.8–5.6)
MEAN PLASMA GLUCOSE: 88 mg/dL

## 2016-10-20 MED ORDER — SERTRALINE HCL 25 MG PO TABS
25.0000 mg | ORAL_TABLET | Freq: Every day | ORAL | Status: DC
  Administered 2016-10-21 – 2016-10-22 (×2): 25 mg via ORAL
  Filled 2016-10-20: qty 1
  Filled 2016-10-20: qty 7
  Filled 2016-10-20 (×4): qty 1

## 2016-10-20 MED ORDER — GABAPENTIN 300 MG PO CAPS
300.0000 mg | ORAL_CAPSULE | Freq: Three times a day (TID) | ORAL | Status: DC
  Administered 2016-10-20 – 2016-10-21 (×2): 300 mg via ORAL
  Filled 2016-10-20 (×7): qty 1

## 2016-10-20 NOTE — Progress Notes (Signed)
D-Self inventory completed and today for today is to rest up and to attend group.  She rates how depressed she is as a 3, along with 3's for her feelings of anxiety and hopelessness.  A-Support offered. Monitored for safety and medications as ordered.  R-Pleasant, attending groups as available and positive peer interaction noted with her roommate, before roommate was discharged. Complained of stomach upset and diarrhea and poor appetite this am. For these complaints she was given Immodium with benefit.

## 2016-10-20 NOTE — BHH Group Notes (Signed)
[place] LCSW Group Therapy  10/20/2016 1:51 PM  Type of Therapy:  Group Therapy  Participation Level:  Active  Participation Quality:  Attentive  Affect:  Anxious  Cognitive:  Oriented  Insight:  Improving  Engagement in Therapy:  Improving  Modes of Intervention:  Confrontation, Discussion, Education, Socialization and Support  Summary of Progress/Problems: MHA Speaker came to talk about his personal journey with substance abuse and addiction. The pt processed ways by which to relate to the speaker. Melissa Allison speaker provided handouts and educational information pertaining to groups and services offered by the [facility].   Melissa Witman N Smart LCSW 10/20/2016, 1:51 PM

## 2016-10-20 NOTE — Progress Notes (Signed)
[facility] MD Progress Note  10/20/2016 7:00 PM Melissa Allison  MRN:  [id number] Subjective:   80 year Caucasian female, single, lives with her boyfriend and her son. Employed as a English as a second language teacher. History of anxiety disorder. Had been on Klonopin for over a decade. Her provider recently lost their license to practice. Patient has been struggling to come of Benzodiazepines. She presented to the ER five times. Says she had not slept in days. She was overwhelmed and expressed suicidal thoughts if she does not get help. UDS was positive for THC.  Chart reviewed today. Patient discussed at team  Staff reports that patient has been interacting well with her roommate. She was emotional yesterday after her son came to visit.use of Ativan has not been helpful   Patient reports worsening anxiety symptoms this evening. Says she has had not slept in days. Patient feels tense and overwhelmed. Says it as is if she is losing her mind. We discussed ways of treating anxiety disorders. We agreed to use Gabapentin at least during the initial phase. We agreed to use Sertraline. Patient understands that it would take a while to get to anti-anxiety dose. We discussed the risks and benefits. Patient understands that it might make her a bit more anxious initially. Patient consented to treatment  Principal Problem: GAD (generalized anxiety disorder) Diagnosis:   Patient Active Problem List   Diagnosis Date Noted  . 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 Aortic dissection, abdominal ([place]) [I71.02] 05/16/2012  . Heart palpitations [R00.2] 04/11/2012  . Well woman exam with routine gynecological exam [F75.102] 10/20/2011  . At risk for hepatitis [Z91.89] 10/20/2011  . Exposure to sexually transmitted disease (STD) [Z20.2] 10/20/2011  . Arm numbness left [R20.0] 10/06/2011  . LSIL (low grade squamous intraepithelial lesion) on Pap smear [IMO0002] 09/09/2011  . Mole  of skin [D22.9] 09/09/2011  . Mood disorder ([place]) [F39] 09/09/2011  . GAD (generalized anxiety disorder) [F41.1] 08/04/2007  . OTH SPEC PERS HX PRESENTING HAZARDS HEALTH OTH [Z91.89] 06/23/2007  . HEADACHE [R51] 02/23/2007  . TOBACCO ABUSE [F17.200] 09/10/2006  . GERD [K21.9] 09/10/2006  . ECZEMA [L25.9] 09/10/2006   Total Time spent with patient: 30 minutes  Past Psychiatric History: As in H&P  Past Medical History:  Past Medical History:  Diagnosis Date  . Anxiety   . Lymphadenitis     Past Surgical History:  Procedure Laterality Date  . APPENDECTOMY    . CHOLECYSTECTOMY    . FOOT SURGERY    . TUBAL LIGATION    . TUBAL LIGATION    . UTERINE FIBROID SURGERY     cauterization of aneurysm   Family History:  Family History  Problem Relation Age of Onset  . Cancer Father        prostate  . Heart disease Maternal Grandmother        unsure   Family Psychiatric  History: As in H&P Social History:  History  Alcohol Use  . 0.0 oz/week    Comment: twice per month     History  Drug Use  . Types: Marijuana    Social History   Social History  . Marital status: Divorced    Spouse name: N/A  . Number of children: 3  . Years of education: N/A   Social History Main Topics  . Smoking status: Current Some Day Smoker    Packs/day: 0.50    Years: 10.00    Types: Cigarettes  . Smokeless  tobacco: Never Used  . Alcohol use 0.0 oz/week     Comment: twice per month  . Drug use: Yes    Types: Marijuana  . Sexual activity: Not Currently    Birth control/ protection: Surgical     Comment: tubal ligation   Other Topics Concern  . None   Social History Narrative   Lives in [place] - with 3 sons.     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   Additional Social History:        Sleep: Poor  Appetite:  Fair  Current Medications: Current Facility-Administered Medications  Medication Dose Route Frequency Provider Last Rate Last Dose  . acetaminophen (TYLENOL) tablet 650  mg  650 mg Oral Q6H PRN Okonkwo, Justina A, NP   650 mg at 10/18/16 2243  . alum & mag hydroxide-simeth (MAALOX/MYLANTA) 200-200-20 MG/5ML suspension 30 mL  30 mL Oral Q4H PRN Okonkwo, Justina A, NP   30 mL at 10/20/16 0054  . cholecalciferol (VITAMIN D) tablet 1,000 Units  1,000 Units Oral Daily Cobos, Myer Peer, MD   1,000 Units at 10/20/16 0809  . hydrocortisone cream 0.5 %   Topical BID Lindon Romp A, NP      . loperamide (IMODIUM) capsule 2 mg  2 mg Oral PRN Cobos, Myer Peer, MD   2 mg at 10/20/16 0811  . magnesium hydroxide (MILK OF MAGNESIA) suspension 30 mL  30 mL Oral Daily PRN Lu Duffel, Justina A, NP      . omega-3 acid ethyl esters (LOVAZA) capsule 1 g  1 g Oral Daily Cobos, Myer Peer, MD   1 g at 10/20/16 0809    Lab Results:  Results for orders placed or performed during the hospital encounter of 10/18/16 (from the past 48 hour(s))  Comprehensive metabolic panel     Status: Abnormal   Collection Time: 10/19/16  6:18 AM  Result Value Ref Range   Sodium 139 135 - 145 mmol/L   Potassium 3.7 3.5 - 5.1 mmol/L   Chloride 107 101 - 111 mmol/L   CO2 24 22 - 32 mmol/L   Glucose, Bld 111 (H) 65 - 99 mg/dL   BUN 12 6 - 20 mg/dL   Creatinine, Ser 0.71 0.44 - 1.00 mg/dL   Calcium 8.9 8.9 - 10.3 mg/dL   Total Protein 7.4 6.5 - 8.1 g/dL   Albumin 4.7 3.5 - 5.0 g/dL   AST 18 15 - 41 U/L   ALT 12 (L) 14 - 54 U/L   Alkaline Phosphatase 53 38 - 126 U/L   Total Bilirubin 0.5 0.3 - 1.2 mg/dL   GFR calc non Af Amer >60 >60 mL/min   GFR calc Af Amer >60 >60 mL/min    Comment: (NOTE) The eGFR has been calculated using the CKD EPI equation. This calculation has not been validated in all clinical situations. eGFR's persistently <60 mL/min signify possible Chronic Kidney Disease.    Anion gap 8 5 - 15    Comment: Performed at [facility], [place] [address] [place], [place] [address]  Lipid panel     Status: None   Collection Time: 10/19/16  6:18 AM  Result Value Ref  Range   Cholesterol 149 0 - 200 mg/dL   Triglycerides 60 <150 mg/dL   HDL 64 >40 mg/dL   Total CHOL/HDL Ratio 2.3 RATIO   VLDL 12 0 - 40 mg/dL   LDL Cholesterol 73 0 - 99 mg/dL    Comment:  Total Cholesterol/HDL:CHD Risk Coronary Heart Disease Risk Table                     Men   Women  1/2 Average Risk   3.4   3.3  Average Risk       5.0   4.4  2 X Average Risk   9.6   7.1  3 X Average Risk  23.4   11.0        Use the calculated Patient Ratio above and the CHD Risk Table to determine the patient's CHD Risk.        ATP III CLASSIFICATION (LDL):  <100     mg/dL   Optimal  100-129  mg/dL   Near or Above                    Optimal  130-159  mg/dL   Borderline  160-189  mg/dL   High  >190     mg/dL   Very High Performed at [place] [address] [place], [place] [address]   TSH     Status: None   Collection Time: 10/19/16  6:18 AM  Result Value Ref Range   TSH 1.818 0.350 - 4.500 uIU/mL    Comment: Performed by a 3rd Generation assay with a functional sensitivity of <=0.01 uIU/mL. Performed at [facility], [place] [address], [place], [place] [address]   Hemoglobin A1c     Status: Abnormal   Collection Time: 10/19/16  6:18 AM  Result Value Ref Range   Hgb A1c MFr Bld 4.7 (L) 4.8 - 5.6 %    Comment: (NOTE)         Pre-diabetes: 5.7 - 6.4         Diabetes: >6.4         Glycemic control for adults with diabetes: <7.0    Mean Plasma Glucose 88 mg/dL    Comment: (NOTE) Performed At: [facility] [place], [place] [id number] Lindon Romp MD [id number] Performed at [facility], [place] [address], [place],  [address]     Blood Alcohol level:  Lab Results  Component Value Date   [facility] <5 10/17/2016   ETH <5 30/16/0109    Metabolic Disorder Labs: Lab Results  Component Value Date   HGBA1C 4.7 (L) 10/19/2016   MPG 88 10/19/2016   No results found for: PROLACTIN Lab Results   Component Value Date   CHOL 149 10/19/2016   TRIG 60 10/19/2016   HDL 64 10/19/2016   CHOLHDL 2.3 10/19/2016   VLDL 12 10/19/2016   LDLCALC 73 10/19/2016    Physical Findings: AIMS:  , ,  ,  ,    CIWA:    COWS:     Musculoskeletal: Strength & Muscle Tone: within normal limits Gait & Station: normal Patient leans: N/A  Psychiatric Specialty Exam: Physical Exam  Constitutional: She is oriented to person, place, and time. She appears distressed.  HENT:  Head: Normocephalic.  Neck: Normal range of motion.  Respiratory:  Purses her mouth so as to slow down her breahting.   Neurological: She is alert and oriented to person, place, and time.  Psychiatric:  As above    ROS  Blood pressure 101/83, pulse 92, temperature 98 F (36.7 C), temperature source Oral, resp. rate 16, height '5\' 2"'$  (1.575 m), weight 45.4 kg (100 lb), last menstrual period 10/16/2016, SpO2 98 %.Body mass index is  18.29 kg/m.  General Appearance: Slim build, overwhelmed. Required a lot of reassurance.   Eye Contact:  Good  Speech:  Decrease rate and tone  Volume:  Decreased  Mood:  Anxious and overwhelmed.   Affect:  Blunted and mood congruent.   Thought Process:  Linear  Orientation:  Full (Time, Place, and Person)  Thought Content:  Catastrophzing a lot.   Suicidal Thoughts:  Off and on  Homicidal Thoughts:  No  Memory:  Unable to assess at this time.   Judgement:  Fair  Insight:  Good  Psychomotor Activity:  Restlessness  Concentration:  Fair   Recall:  Unable to assess at this time.   Fund of Knowledge:  Fair  Language:  Good  Akathisia:  NA  Handed:    AIMS (if indicated):     Assets:  Desire for Improvement Housing Intimacy Physical Health Vocational/Educational  ADL's:  Intact  Cognition:  WNL  Sleep:  Number of Hours: 2.25     Treatment Plan Summary: Patient is overwhelmed by fear of having a full blown panic attack. She has not been sleeping well at night. She had expressed  suicidal thoughts if she continues to feel this way. We have discussed use of SSRI and Gabapentin. Needs further evaluation.   Psychiatric: Anxiety Disorder THC use disorder  Medical: GERD  Psychosocial:   PLAN: 1. Sertraline 25 mg daily. Would titrate gradually 2. Gabapentin 300 mg TID 3. Encourage unit groups and activities 4. Monitor mood, behavior and interaction with peers    Artist Beach, MD 10/20/2016, 7:00 PM

## 2016-10-20 NOTE — Progress Notes (Signed)
Patient ID: Melissa Allison, female   DOB: 11/22/1975, 41 y.o.   MRN: [id number]  D: Patient walking down hallway on approach. Pt presented with depressed mood and flat affect. Pt very emotional crying about children visiting. Pt stated she does not like her children seeing her here and the children left early because they hated seeing her. Pt denies SI/HI/AVH.  A: Medications administered as prescribed. Emotional support given and will continue to monitor pt's progress for stabilization. R: Patient is safe on the unit.

## 2016-10-20 NOTE — Progress Notes (Signed)
Patient ID: Melissa Allison, female   DOB: July 06, 1975, 41 y.o.   MRN: [id number] Asked writer to check what the other nurse had given her for anxiety, felt she gave her the wrong thing because she felt worse instead of better. She has not seen the Dr yet today. Pulled the pill so she could see what she took earlier, and she could tell it was the right thing, Ativan, but despite of that she still feels more anxious. Expecting a friend to visit her tonight, a good distraction until she speaks with the Dr.

## 2016-10-21 DIAGNOSIS — F411 Generalized anxiety disorder: Secondary | ICD-10-CM

## 2016-10-21 MED ORDER — GABAPENTIN 100 MG PO CAPS
100.0000 mg | ORAL_CAPSULE | Freq: Two times a day (BID) | ORAL | Status: DC | PRN
  Administered 2016-10-21 – 2016-10-22 (×2): 100 mg via ORAL

## 2016-10-21 MED ORDER — OMEPRAZOLE 20 MG PO CPDR
20.0000 mg | DELAYED_RELEASE_CAPSULE | Freq: Every day | ORAL | Status: DC
  Administered 2016-10-21 – 2016-10-22 (×2): 20 mg via ORAL
  Filled 2016-10-21 (×3): qty 1
  Filled 2016-10-21: qty 7

## 2016-10-21 MED ORDER — GABAPENTIN 300 MG PO CAPS
300.0000 mg | ORAL_CAPSULE | Freq: Every day | ORAL | Status: DC
  Administered 2016-10-21: 300 mg via ORAL
  Filled 2016-10-21 (×2): qty 1
  Filled 2016-10-21: qty 7

## 2016-10-21 MED ORDER — PANTOPRAZOLE SODIUM 40 MG PO TBEC
40.0000 mg | DELAYED_RELEASE_TABLET | Freq: Every day | ORAL | Status: DC
  Filled 2016-10-21 (×2): qty 1

## 2016-10-21 MED ORDER — GABAPENTIN 100 MG PO CAPS
100.0000 mg | ORAL_CAPSULE | Freq: Two times a day (BID) | ORAL | Status: DC
  Administered 2016-10-21 – 2016-10-22 (×2): 100 mg via ORAL
  Filled 2016-10-21 (×2): qty 14
  Filled 2016-10-21 (×5): qty 1

## 2016-10-21 NOTE — Progress Notes (Signed)
Recreation Therapy Notes   Date: 10/21/2016 Time: 9:30am Location: 300 Hall Dayroom  Group Topic: Stress Management  Goal Area(s) Addresses:  Patient will verbalize importance of using healthy stress management.  Patient will identify positive emotions associated with healthy stress management.   Behavioral Response: Engaged  Intervention: Stress Management  Activity :  Meditation. Recreation Therapy Intern introduced the stress management technique of meditation. Recreation Therapy Intern read a script that allowed patients to help patients meditate. Recreation Therapy Intern played zen meditation music. Patients were to follow along as script was read to engage in the activity.  Education: Stress Management, Discharge Planning.   Education Outcome: Acknowledges edcuation  Clinical Observations/Feedback: Pt attended group.  Donovan Kail, Recreation Therapy Intern  Victorino Sparrow, LRT/CTRS
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
## 2016-10-21 NOTE — BHH Group Notes (Signed)
[place] LCSW Group Therapy  10/21/2016 1:53 PM  Type of Therapy:  Group Therapy  Participation Level:  Did Not Attend-pt invited. Chose to remain in bed.   Summary of Progress/Problems: Today's Topic: Overcoming Obstacles. Patients identified one short term goal and potential obstacles in reaching this goal. Patients processed barriers involved in overcoming these obstacles. Patients identified steps necessary for overcoming these obstacles and explored motivation (internal and external) for facing these difficulties head on.   Kavitha Lansdale N Smart LCSW 10/21/2016, 1:53 PM

## 2016-10-21 NOTE — Progress Notes (Signed)
D: Pt presents with an animated affect and anxious mood. Pt rates anxiety 4/10. Pt reported poor sleep last night. Pt reported that she started a new sleep med last night and that it caused her to have palpitations, panic attacks and feeling numb while sleeping. Pt denies SI/HI. Pt denies depression. Pt verbalized signing a 72 hour request for discharge form and plans to return home. A: Medications reviewed with. Medications administered as ordered per MD. Verbal support provided. Pt encouraged to attend groups. 15 minute checks performed for safety.  R: Pt compliant with tx.

## 2016-10-21 NOTE — Progress Notes (Signed)
D: Patient reports panic attacks that wakes her up. Stated she does breathing exercise that helps her to go back to sleep. Was started on Neurontin 300 mg and Zoloft but patient stated that she will like to start the Zoloft tomorrow morning. Denies pain, SI/HI, AH/VH at this time. Active on unit.    A: Staff offered support and encouragement as needed. Routine safety checks maintained. Will continue to monitor patient.   R: Patient remains safe on unit.

## 2016-10-21 NOTE — Progress Notes (Signed)
Pt woke up said she needs something for heart burn, pt's RN off unit charge  RN tried to assist pt went back to her room. Staff inform pt charge RN will help pt said forget it she really do not need anything.

## 2016-10-21 NOTE — Progress Notes (Signed)
Spoke with patient on individual basis for goals/ wrap up group. Pt shared she had a greay day and went to group and read books today.

## 2016-10-22 ENCOUNTER — Encounter (HOSPITAL_COMMUNITY): Payer: Self-pay | Admitting: Behavioral Health

## 2016-10-22 MED ORDER — SERTRALINE HCL 25 MG PO TABS: 25.0000 mg | ORAL_TABLET | Freq: Every day | ORAL | 0 refills | Status: AC

## 2016-10-22 MED ORDER — GABAPENTIN 300 MG PO CAPS: 300.0000 mg | ORAL_CAPSULE | Freq: Every day | ORAL | 0 refills | Status: AC

## 2016-10-22 MED ORDER — HYDROXYZINE HCL 25 MG PO TABS
25.0000 mg | ORAL_TABLET | Freq: Four times a day (QID) | ORAL | Status: DC | PRN
  Filled 2016-10-22: qty 10

## 2016-10-22 MED ORDER — OMEGA-3-ACID ETHYL ESTERS 1 G PO CAPS: 1.0000 g | ORAL_CAPSULE | Freq: Every day | ORAL | 0 refills | Status: AC

## 2016-10-22 MED ORDER — GABAPENTIN 100 MG PO CAPS: 100.0000 mg | ORAL_CAPSULE | Freq: Two times a day (BID) | ORAL | 0 refills | Status: AC
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
## 2016-10-22 NOTE — Progress Notes (Signed)
D: Pt was in the hallway upon initial approach.  Pt presents with anxious affect and mood.  Reports her day was "okay."  She reports she had "panic attacks during the day."  Goal is to "try to not have a panic attack."  Pt denies SI/HI, denies hallucinations, denies pain.  Pt has been visible in milieu interacting with peers.  A: Introduced self to pt.  Actively listened to pt and offered support and encouragement. Medication administered per order.  Q15 minute safety checks maintained.  R: Pt is safe on the unit.  Pt is compliant with medication.  Pt verbally contracts for safety.  Will continue to monitor and assess.

## 2016-10-22 NOTE — Tx Team (Signed)
Interdisciplinary Treatment and Diagnostic Plan Update  10/22/2016 Time of Session: [place] MRN: [id number]  Principal Diagnosis: Panic disorder  Secondary Diagnoses: Principal Problem:   Panic disorder Active Problems:   GAD (generalized anxiety disorder)   Current Medications:  Current Facility-Administered Medications  Medication Dose Route Frequency Provider Last Rate Last Dose  . acetaminophen (TYLENOL) tablet 650 mg  650 mg Oral Q6H PRN Okonkwo, Justina A, NP   650 mg at 10/18/16 2243  . alum & mag hydroxide-simeth (MAALOX/MYLANTA) 200-200-20 MG/5ML suspension 30 mL  30 mL Oral Q4H PRN Okonkwo, Justina A, NP   30 mL at 10/21/16 0619  . cholecalciferol (VITAMIN D) tablet 1,000 Units  1,000 Units Oral Daily Cobos, Myer Peer, MD   1,000 Units at 10/21/16 [phone]  . gabapentin (NEURONTIN) capsule 100 mg  100 mg Oral BID Artist Beach, MD   100 mg at 10/21/16 1633  . gabapentin (NEURONTIN) capsule 100 mg  100 mg Oral BID PRN Artist Beach, MD   100 mg at 10/21/16 1418  . gabapentin (NEURONTIN) capsule 300 mg  300 mg Oral QHS Izediuno, Vincent A, MD   300 mg at 10/21/16 2217  . hydrocortisone cream 0.5 %   Topical BID Lindon Romp A, NP      . loperamide (IMODIUM) capsule 2 mg  2 mg Oral PRN Cobos, Myer Peer, MD   2 mg at 10/21/16 0900  . magnesium hydroxide (MILK OF MAGNESIA) suspension 30 mL  30 mL Oral Daily PRN Okonkwo, Justina A, NP      . omega-3 acid ethyl esters (LOVAZA) capsule 1 g  1 g Oral Daily Cobos, Myer Peer, MD   1 g at 10/21/16 0843  . omeprazole (PRILOSEC) capsule 20 mg  20 mg Oral Daily Cobos, Myer Peer, MD   20 mg at 10/21/16 1409  . sertraline (ZOLOFT) tablet 25 mg  25 mg Oral Daily Izediuno, Laruth Bouchard, MD   25 mg at 10/21/16 0844   PTA Medications: Prescriptions Prior to Admission  Medication Sig Dispense Refill Last Dose  . acetaminophen (TYLENOL) 325 MG tablet Take 650 mg by mouth every 6 (six) hours as needed for mild pain.    10/16/2016 at Unknown time  . aspirin EC 325 MG tablet Take 325 mg by mouth every 6 (six) hours as needed for mild pain.   10/16/2016 at Unknown time  . cholecalciferol (VITAMIN D) 1000 units tablet Take 1,000 Units by mouth daily.   4-5 days  . clonazePAM (KLONOPIN) 0.5 MG tablet Take 1 tablet (0.5 mg total) by mouth 2 (two) times daily as needed for anxiety. 20 tablet 0 2 weeks  . hydrOXYzine (ATARAX/VISTARIL) 25 MG tablet Take 1 tablet (25 mg total) by mouth every 6 (six) hours as needed for anxiety. 20 tablet 0 10/17/2016 at 0400  . ibuprofen (ADVIL,MOTRIN) 200 MG tablet Take 400 mg by mouth every 6 (six) hours as needed. pain   PRN  . Omega-3 Fatty Acids (FISH OIL) 1000 MG CAPS Take 1 capsule by mouth daily.   1-2 weeks  . omeprazole (PRILOSEC) 20 MG capsule TAKE 1 CAPSULE EVERY DAY (Patient not taking: Reported on 10/11/2016) 30 capsule 7 Not Taking at Unknown time  . S-Adenosylmethionine-B6-B12-FA (MOOD PLUS STRESS RELIEF PO) Take 1 tablet by mouth daily as needed (lower stress).   4-5 days  . traZODone (DESYREL) 50 MG tablet Take 1 tablet (50 mg total) by mouth at bedtime as needed for sleep. (Patient not taking:  Reported on 10/17/2016) 10 tablet 0 Not Taking at Unknown time  . zolpidem (AMBIEN) 5 MG tablet Take 1 tablet (5 mg total) by mouth at bedtime as needed for sleep. (Patient not taking: Reported on 10/17/2016) 7 tablet 0 Not Taking at Unknown time    Patient Stressors: Substance abuse  Patient Strengths: Ability for insight Motivation for treatment/growth  Treatment Modalities: Medication Management, Group therapy, Case management,  1 to 1 session with clinician, Psychoeducation, Recreational therapy.   Physician Treatment Plan for Primary Diagnosis: Panic disorder Long Term Goal(s): Improvement in symptoms so as ready for discharge Improvement in symptoms so as ready for discharge   Short Term Goals: Ability to identify changes in lifestyle to reduce recurrence of condition will  improve Ability to verbalize feelings will improve Ability to disclose and discuss suicidal ideas Ability to demonstrate self-control will improve Ability to identify and develop effective coping behaviors will improve Ability to maintain clinical measurements within normal limits will improve Compliance with prescribed medications will improve  Medication Management: Evaluate patient's response, side effects, and tolerance of medication regimen.  Therapeutic Interventions: 1 to 1 sessions, Unit Group sessions and Medication administration.  Evaluation of Outcomes: Adequate for discharge   Physician Treatment Plan for Secondary Diagnosis: Principal Problem:   Panic disorder Active Problems:   GAD (generalized anxiety disorder)  Long Term Goal(s): Improvement in symptoms so as ready for discharge Improvement in symptoms so as ready for discharge   Short Term Goals: Ability to identify changes in lifestyle to reduce recurrence of condition will improve Ability to verbalize feelings will improve Ability to disclose and discuss suicidal ideas Ability to demonstrate self-control will improve Ability to identify and develop effective coping behaviors will improve Ability to maintain clinical measurements within normal limits will improve Compliance with prescribed medications will improve     Medication Management: Evaluate patient's response, side effects, and tolerance of medication regimen.  Therapeutic Interventions: 1 to 1 sessions, Unit Group sessions and Medication administration.  Evaluation of Outcomes: Adequate for discharge    RN Treatment Plan for Primary Diagnosis: Panic disorder Long Term Goal(s): Knowledge of disease and therapeutic regimen to maintain health will improve  Short Term Goals: Ability to remain free from injury will improve, Ability to verbalize feelings will improve and Ability to disclose and discuss suicidal ideas  Medication Management: RN will  administer medications as ordered by provider, will assess and evaluate patient's response and provide education to patient for prescribed medication. RN will report any adverse and/or side effects to prescribing provider.  Therapeutic Interventions: 1 on 1 counseling sessions, Psychoeducation, Medication administration, Evaluate responses to treatment, Monitor vital signs and CBGs as ordered, Perform/monitor CIWA, COWS, AIMS and Fall Risk screenings as ordered, Perform wound care treatments as ordered.  Evaluation of Outcomes: Adequate for discharge    LCSW Treatment Plan for Primary Diagnosis: Panic disorder Long Term Goal(s): Safe transition to appropriate next level of care at discharge, Engage patient in therapeutic group addressing interpersonal concerns.  Short Term Goals: Engage patient in aftercare planning with referrals and resources, Facilitate patient progression through stages of change regarding substance use diagnoses and concerns and Identify triggers associated with mental health/substance abuse issues  Therapeutic Interventions: Assess for all discharge needs, 1 to 1 time with Social worker, Explore available resources and support systems, Assess for adequacy in community support network, Educate family and significant other(s) on suicide prevention, Complete Psychosocial Assessment, Interpersonal group therapy.  Evaluation of Outcomes: Adequate for discharge  Progress in Treatment: Attending groups: Yes  Participating in groups: yes, when she attends  Taking medication as prescribed: Yes. Toleration medication: No. Family/Significant other contact made: SPE completed with pt's mother. Collateral information also obtained.  Patient understands diagnosis: Yes. Discussing patient identified problems/goals with staff: Yes. Medical problems stabilized or resolved: Yes. Denies suicidal/homicidal ideation: Yes. Issues/concerns per patient self-inventory: No. Other: n/a    New problem(s) identified: No, Describe:  n/a  New Short Term/Long Term Goal(s): benzo detox, medication stabilization, development of comprhensive mental wellness/sobriety plan.   Discharge Plan or Barriers:  Pt wants follow-up in [place] but unsure if she has appropriate ID with [employer]. Pt follow-up also made at [facility] and [facility] being that she currently lives in [place]. [facility] pamphlet provided and Peer Support Specialist met with her during her stay to discuss services.   Reason for Continuation of Hospitalization: none  Estimated Length of Stay: Thursday, 10/22/16  Attendees: Patient: 10/22/2016 8:43 AM  Physician: Dr. Dwyane Dee MD; Dr. Sanjuana Letters MD 10/22/2016 8:43 AM  Nursing: Carlynn Purl RN 10/22/2016 8:43 AM  RN Care Manager: Rhunette Croft 10/22/2016 8:43 AM  Social Worker: Maxie Better, LCSW 10/22/2016 8:43 AM  Recreational Therapist: x 10/22/2016 8:43 AM  Other:  Lindell Spar NP; Mordecai Maes NP 10/22/2016 8:43 AM  Other:  10/22/2016 8:43 AM  Other: 10/22/2016 8:43 AM    Scribe for Treatment Team: Roberts, LCSW 10/22/2016 8:43 AM

## 2016-10-22 NOTE — Plan of Care (Signed)
Problem: Activity: Goal: Sleeping patterns will improve Outcome: Progressing Slept 6.75 hours last night per flowsheet.

## 2016-10-22 NOTE — Discharge Summary (Signed)
Physician Discharge Summary Note  Patient:  Melissa Allison is an 41 y.o., female MRN:  [id number] DOB:  02-27-1976 Patient phone:  [phone] (home)  Patient address:   [place] [address],  Total Time spent with patient: 30 minutes  Date of Admission:  10/18/2016 Date of Discharge: 10/22/2016  Reason for Admission:  Panic disorder  Principal Problem: Panic disorder Discharge Diagnoses: Patient Active Problem List   Diagnosis Date Noted  . GAD (generalized anxiety disorder) [F41.1]   . 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 Aortic dissection, abdominal ([place]) [I71.02] 05/16/2012  . Heart palpitations [R00.2] 04/11/2012  . Well woman exam with routine gynecological exam [P95.093] 10/20/2011  . At risk for hepatitis [Z91.89] 10/20/2011  . Exposure to sexually transmitted disease (STD) [Z20.2] 10/20/2011  . Arm numbness left [R20.0] 10/06/2011  . LSIL (low grade squamous intraepithelial lesion) on Pap smear [IMO0002] 09/09/2011  . Mole of skin [D22.9] 09/09/2011  . Mood disorder ([place]) [F39] 09/09/2011  . Panic disorder [F41.0] 08/04/2007  . OTH SPEC PERS HX PRESENTING HAZARDS HEALTH OTH [Z91.89] 06/23/2007  . HEADACHE [R51] 02/23/2007  . TOBACCO ABUSE [F17.200] 09/10/2006  . GERD [K21.9] 09/10/2006  . ECZEMA [L25.9] 09/10/2006    Past Psychiatric History: GAD, Polysybstance abuse disorder  Past Medical History:  Past Medical History:  Diagnosis Date  . Anxiety   . Lymphadenitis     Past Surgical History:  Procedure Laterality Date  . APPENDECTOMY    . CHOLECYSTECTOMY    . FOOT SURGERY    . TUBAL LIGATION    . TUBAL LIGATION    . UTERINE FIBROID SURGERY     cauterization of aneurysm   Family History:  Family History  Problem Relation Age of Onset  . Cancer Father        prostate  . Heart disease Maternal Grandmother        unsure   Family Psychiatric  History: Mother - poly  substance abuse and alcohol abuse( while pregnant). Sister-polysubstance abuse Father- polysubstance and alcohol abuse.  Social History:  History  Alcohol Use  . 0.0 oz/week    Comment: twice per month     History  Drug Use  . Types: Marijuana    Social History   Social History  . Marital status: Divorced    Spouse name: N/A  . Number of children: 3  . Years of education: N/A   Social History Main Topics  . Smoking status: Current Some Day Smoker    Packs/day: 0.50    Years: 10.00    Types: Cigarettes  . Smokeless tobacco: Never Used  . Alcohol use 0.0 oz/week     Comment: twice per month  . Drug use: Yes    Types: Marijuana  . Sexual activity: Not Currently    Birth control/ protection: Surgical     Comment: tubal ligation   Other Topics Concern  . None   Social History Narrative   Lives in [place] - with 3 sons.     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    Hospital Course:  Melissa C Holbrookis an 41 y.o.female. Pt presents voluntarily to [facility] c/o chest pain, sob, and insomnia from Klonopin withdrawa. Per chart review, pt presented to [facility] 10/11/16 &10/12/16 for similar symptoms. Pt is anxious and oriented x 4. Pt appears restless and is irritable. Her mood is labile as she sometimes becomes tearful. Pt reports she hasn't slept in two weeks.  She says she had been taking Klonopin 0.5mg  daily for 10 years until she took herself off 2 weeks ago (without consulting her MD). Pt says she hasn't been able to eat. During assessment, pt begins visibly trembling. She reports she felt she was "numb" during the years of Klonopin use, and she has missed things that were going on around her with her boyfriend and family. Pt says she feels like her boyfriend is doing drugs. She says she visited another son who appeared like he was using drugs too. Per pt, her mom, sister &dad all have substance abuse. Pt herself states she smokes approx 1 gram THC daily but say she hasn't been able to smoke  THC since 10/12/16. Pt sts [facility] prescribed her Klonopin. Pt endorses fatigue, irritability and tearfulness. She reports no hx of outpatient or inpatient [place] treatment. Pt says, "I can't function on my own. I feel like I'm dying." Pt requests to come into an inpatient psychiatric facility. Pt denies SI currently or at any time in the past. Pt denies any history of suicide attempts and denies history of self-mutilation. Pt denies homicidal thoughts or physical aggression. Pt denies having access to firearms. Pt denies having any legal problems at this time. Pt denies hallucinations. Pt does not appear to be responding to internal stimuli and exhibits no delusional thought. Pt's reality testing appears to be intact. Her mother is bedside Melissa Allison [phone]. She reports she herself has "anxiety". Mom says pt called mom "in a total panic."  After the above admission assessment and during this hospital course, patients presenting symptoms were identified. Labs were reviewed and her UDS was positive for amphetamines and THC. She presented with a history of substance abuse as noted above. Detoxification treatments were administered as approproaite. Patient was treated and discharged with the following medications; Neurontin 100 mg po bid and 300 mg po daily at bedtime for anxiety, Zoloft 25 mg po daily for depression, Vistaril 25 mg po q6hrs as needed for anxiety. She was advised to follow-up with her outpatient provider to see if she should resume her home medications as previously prescribed. She remained compliant with therapeutic milieu and actively participated in group counseling sessions. AA/NA meetings were offered & held on the unit with patient active participation. While on the unit, patient was able to verbalize learned coping skills for better management of depression and suicidal thoughts and to better maintain these thoughts and symptoms when returning home.  During the  course of her hospitalization, improvement was monitored by observation and patients daily  report of symptom reduction, presentation of good affect, and overall improvement in mood & behavior. Patient tolerated her treatment regimen without any adverse effects reported.  Aqsa's case was presented during treatment team meeting this morning. The team members were all in agreement that Jalisha was both mentally & medically stable to be discharged to continue mental health care on an outpatient basis as noted below. She was provided with all the necessary information needed to make this appointment without problems.  Upon discharge, Aeva  denied any SI/HI, AVH, delusional thoughts, or paranoia. She denied  any substance withdrawal symptoms. She was provided with prescriptions  of her [facility] discharge medications to be taken to her phamacy. She left [facility] with all personal belongings in no apparent distress. Transportation per patients arrangement.  Physical Findings: AIMS:  , ,  ,  ,    CIWA:    COWS:     Musculoskeletal: Strength &  Muscle Tone: within normal limits Gait & Station: normal Patient leans: N/A  Psychiatric Specialty Exam: SEE SRA BY MD  Physical Exam  Nursing note and vitals reviewed. Constitutional: She is oriented to person, place, and time.  Neurological: She is alert and oriented to person, place, and time.    Review of Systems  Psychiatric/Behavioral: Positive for substance abuse (Hx of substnace abuse ). Negative for hallucinations, memory loss and suicidal ideas (Hx of substnace abuse ). Depression: improved. The patient does not have insomnia. Nervous/anxious: improved.   All other systems reviewed and are negative.   Blood pressure 110/72, pulse 84, temperature 98.7 F (37.1 C), temperature source Oral, resp. rate 16, height 5\' 2"  (1.575 m), weight 100 lb (45.4 kg), last menstrual period 10/16/2016, SpO2 98 %.Body mass index is 18.29 kg/m.   Have you used any form of  tobacco in the last 30 days? (Cigarettes, Smokeless Tobacco, Cigars, and/or Pipes): Yes  Has this patient used any form of tobacco in the last 30 days? (Cigarettes, Smokeless Tobacco, Cigars, and/or Pipes)  N/A  Blood Alcohol level:  Lab Results  Component Value Date   [facility] <5 10/17/2016   ETH <5 78/67/6720    Metabolic Disorder Labs:  Lab Results  Component Value Date   HGBA1C 4.7 (L) 10/19/2016   MPG 88 10/19/2016   No results found for: PROLACTIN Lab Results  Component Value Date   CHOL 149 10/19/2016   TRIG 60 10/19/2016   HDL 64 10/19/2016   CHOLHDL 2.3 10/19/2016   VLDL 12 10/19/2016   LDLCALC 73 10/19/2016    See Psychiatric Specialty Exam and Suicide Risk Assessment completed by Attending Physician prior to discharge.  Discharge destination:  Home  Is patient on multiple antipsychotic therapies at discharge:  No   Has Patient had three or more failed trials of antipsychotic monotherapy by history:  No  Recommended Plan for Multiple Antipsychotic Therapies: NA   Allergies as of 10/22/2016      Reactions   Sulfa Antibiotics Rash   Cephalexin    REACTION: tongue swelling   Conjugated Estrogens    REACTION: unspecified   Cyclobenzaprine Hcl    REACTION: unspecified   Ephedrine    Ephedrine Sulfate    Rabeprazole Sodium    REACTION: unspecified   Sulfonamide Derivatives    REACTION: unspecified      Medication List    STOP taking these medications   clonazePAM 0.5 MG tablet Commonly known as:  KLONOPIN   Fish Oil 1000 MG Caps   ibuprofen 200 MG tablet Commonly known as:  ADVIL,MOTRIN   omeprazole 20 MG capsule Commonly known as:  PRILOSEC   traZODone 50 MG tablet Commonly known as:  DESYREL   zolpidem 5 MG tablet Commonly known as:  AMBIEN     TAKE these medications     Indication  acetaminophen 325 MG tablet Commonly known as:  TYLENOL Take 650 mg by mouth every 6 (six) hours as needed for mild pain.  Indication:  Pain   aspirin EC  325 MG tablet Take 325 mg by mouth every 6 (six) hours as needed for mild pain.  Indication:  pain   cholecalciferol 1000 units tablet Commonly known as:  VITAMIN D Take 1,000 Units by mouth daily.  Indication:  vitamin deficiency   gabapentin 100 MG capsule Commonly known as:  NEURONTIN Take 1 capsule (100 mg total) by mouth 2 (two) times daily.  Indication:  anxiety   gabapentin 300 MG capsule Commonly  known as:  NEURONTIN Take 1 capsule (300 mg total) by mouth at bedtime.  Indication:  anxiety   hydrOXYzine 25 MG tablet Commonly known as:  ATARAX/VISTARIL Take 1 tablet (25 mg total) by mouth every 6 (six) hours as needed for anxiety.  Indication:  anxiety   MOOD PLUS STRESS RELIEF PO Take 1 tablet by mouth daily as needed (lower stress).  Indication:  stress relief   omega-3 acid ethyl esters 1 g capsule Commonly known as:  LOVAZA Take 1 capsule (1 g total) by mouth daily.  Indication:  High Amount of Triglycerides in the Blood   sertraline 25 MG tablet Commonly known as:  ZOLOFT Take 1 tablet (25 mg total) by mouth daily.  Indication:  Major Depressive Disorder      Follow-up Information    [facility] Follow up on 11/02/2016.   Specialty:  Behavioral Health Why:  Therapy at 9:00AM on Monday 11/02/16. Please be sure to cancel/reschedule if necessary at least 48 hours prior to this appt. Thank you.  Contact information: [place], [place] [address] [phone]        [facility] Follow up on 10/26/2016.   Why:  PLEASE NOTE: YOU MUST HAVE PROOF OF RESIDENCY IN [place] TO BE SEEN HERE. Hospital follow-up with Calli on Monday 7/23 at 1:00PM. Please bring: photo ID, social security card, proof of residency in [facility], and proof of income. Thank you.         [facility] Follow up.   Specialty:  Behavioral Health Why:  IF YOU DO NOT HAVE PROOF OF RESIDENCY IN [place], YOU  WILL HAVE TO BE SEEN AT [place]. Please walk in within 7 days of hospital discharge to be assessed for outpatient mental health services. Walk in hours: Mon-Fri 8am-9am. Contact information: [place] [address] [phone]           Follow-up recommendations:  Follow up with your outpatient provided for any medical issues. Activity & diet as recommended by your primary care provider.  Comments:  Patient is instructed prior to discharge to: Take all medications as prescribed by his/her mental healthcare provider. Report any adverse effects and or reactions from the medicines to his/her outpatient provider promptly. Patient has been instructed & cautioned: To not engage in alcohol and or illegal drug use while on prescription medicines. In the event of worsening symptoms, patient is instructed to call the crisis hotline, 911 and or go to the nearest ED for appropriate evaluation and treatment of symptoms. To follow-up with his/her primary care provider for your other medical issues, concerns and or health care needs.  Signed: Mordecai Maes, NP 10/22/2016, 8:54 AM

## 2016-10-22 NOTE — Progress Notes (Signed)
  [facility] Adult Case Management Discharge Plan :  Will you be returning to the same living situation after discharge:  Yes,  home to [facility] until next week when she returns to [place] for work parttime At discharge, do you have transportation home?: Yes,  family member/mother Do you have the ability to pay for your medications: Yes,  no insurance per pt.   Release of information consent forms completed and submitted to medical records by CSW.  Patient to Follow up at: Follow-up Information    [facility] Follow up on 11/02/2016.   Specialty:  Behavioral Health Why:  Therapy at 9:00AM on Monday 11/02/16. Please be sure to cancel/reschedule if necessary at least 48 hours prior to this appt. Thank you.  Contact information: [place], [place] [address] [phone]        [facility] Follow up on 10/26/2016.   Why:  PLEASE NOTE: YOU MUST HAVE PROOF OF RESIDENCY IN [place] TO BE SEEN HERE. Hospital follow-up with Calli on Monday 7/23 at 1:00PM. Please bring: photo ID, social security card, proof of residency in [facility], and proof of income. Thank you.         [facility] Follow up.   Specialty:  Behavioral Health Why:  IF YOU DO NOT HAVE PROOF OF RESIDENCY IN [place], YOU WILL HAVE TO BE SEEN AT [place]. Please walk in within 7 days of hospital discharge to be assessed for outpatient mental health services. Walk in hours: Mon-Fri 8am-9am. Contact information: [place] [address] [phone]           Next level of care provider has access to [place] and Suicide Prevention discussed: Yes,  SPE completed with pt's mother and with pt. SPI pamphlet and mobile crisis information provided to pt.   Have you used any form of tobacco in the last 30 days? (Cigarettes, Smokeless Tobacco, Cigars, and/or Pipes): Yes  Has patient been referred to  the Quitline?: Patient refused referral  Patient has been referred for addiction treatment: Yes  Annaliyah Willig N Smart LCSW 10/22/2016, 8:46 AM

## 2016-10-22 NOTE — Progress Notes (Cosign Needed)
Pt discharged home with her mother. Pt was ambulatory, stable and appreciative at that time. All papers and prescriptions were given and valuables returned. Verbal understanding expressed. Denies SI/HI and A/VH. Pt given opportunity to express concerns and ask questions.
